# Patient Record
Sex: Male | Born: 2000 | Race: White | Hispanic: No | Marital: Single | Smoking: Current every day smoker
Health system: Southern US, Community
[De-identification: ages and names within clinical notes are randomized; demographics above are authoritative.]

## PROBLEM LIST (undated history)

## (undated) DIAGNOSIS — R569 Unspecified convulsions: Secondary | ICD-10-CM

## (undated) DIAGNOSIS — F329 Major depressive disorder, single episode, unspecified: Secondary | ICD-10-CM

## (undated) DIAGNOSIS — F4010 Social phobia, unspecified: Secondary | ICD-10-CM

## (undated) DIAGNOSIS — J302 Other seasonal allergic rhinitis: Secondary | ICD-10-CM

## (undated) DIAGNOSIS — F4310 Post-traumatic stress disorder, unspecified: Secondary | ICD-10-CM

## (undated) DIAGNOSIS — F840 Autistic disorder: Secondary | ICD-10-CM

## (undated) DIAGNOSIS — F909 Attention-deficit hyperactivity disorder, unspecified type: Secondary | ICD-10-CM

## (undated) HISTORY — PX: CIRCUMCISION: SUR203

## (undated) HISTORY — DX: Unspecified convulsions: R56.9

---
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
## 2020-01-17 ENCOUNTER — Other Ambulatory Visit (INDEPENDENT_AMBULATORY_CARE_PROVIDER_SITE_OTHER): Payer: Self-pay | Admitting: Pediatrics

## 2020-01-17 DIAGNOSIS — G40309 Generalized idiopathic epilepsy and epileptic syndromes, not intractable, without status epilepticus: Secondary | ICD-10-CM

## 2020-02-20 ENCOUNTER — Other Ambulatory Visit (INDEPENDENT_AMBULATORY_CARE_PROVIDER_SITE_OTHER): Payer: Self-pay | Admitting: Pediatrics

## 2020-02-20 DIAGNOSIS — G40309 Generalized idiopathic epilepsy and epileptic syndromes, not intractable, without status epilepticus: Secondary | ICD-10-CM

## 2020-02-20 DIAGNOSIS — G4700 Insomnia, unspecified: Secondary | ICD-10-CM

## 2020-02-21 ENCOUNTER — Telehealth (INDEPENDENT_AMBULATORY_CARE_PROVIDER_SITE_OTHER): Payer: Self-pay | Admitting: Pediatrics

## 2020-02-21 NOTE — Telephone Encounter (Signed)
Electronic refill request has been sent to Dr. Sharene Skeans in response to this message

## 2020-02-21 NOTE — Telephone Encounter (Signed)
Please send to the pharmacy °

## 2020-02-21 NOTE — Telephone Encounter (Signed)
°  Who's calling (name and relationship to patient) : [name] [facility]   Best contact number: [phone]  Provider they see: Dr. Sharene Skeans  Reason for call: [name] called to request a refill for the Rx: clonazepam and lamotrigine  [name] says that Wes only has enough medication to take in the morning then he will be out of these prescriptions.     PRESCRIPTION REFILL ONLY  Name of prescription: Clonazepam and lamotrigine  [facility] [address]

## 2020-03-21 ENCOUNTER — Other Ambulatory Visit (INDEPENDENT_AMBULATORY_CARE_PROVIDER_SITE_OTHER): Payer: Self-pay | Admitting: Pediatrics

## 2020-03-21 DIAGNOSIS — G40309 Generalized idiopathic epilepsy and epileptic syndromes, not intractable, without status epilepticus: Secondary | ICD-10-CM

## 2020-03-21 DIAGNOSIS — F6381 Intermittent explosive disorder: Secondary | ICD-10-CM

## 2020-03-21 DIAGNOSIS — G4700 Insomnia, unspecified: Secondary | ICD-10-CM

## 2020-03-23 ENCOUNTER — Telehealth (INDEPENDENT_AMBULATORY_CARE_PROVIDER_SITE_OTHER): Payer: Self-pay | Admitting: Pediatrics

## 2020-03-23 NOTE — Telephone Encounter (Signed)
I will not be able to see him this week unless there is a cancellation.  After that I am willing to see him at the next opening that we have even if it is a new patient.

## 2020-03-23 NOTE — Telephone Encounter (Signed)
Patient is scheduled for Wednesday at 11:15

## 2020-03-23 NOTE — Telephone Encounter (Signed)
Please send to the pharmacy °

## 2020-03-23 NOTE — Telephone Encounter (Signed)
Spoke with grandmother about her phone message. She states that he has had three really bad seizures in the last four weeks. She states that one seizure made him fall off the covered porch. She states that he was talking and went out. She states that he had another one at his dad's house where he almost hit the floor but dad caught him. She reports that the other seizure happened in the bathroom. She reports that all of them have made him fall. She states that she think he needs to have a MRI scan to figure out what is going on. She states that the seizures lasted no more than three minutes. She states that the patient was taken to the hospital for the first seizure. She also reports that the patient's memory has gotten so bad that he cannot retain any information. Please advise.  Refills have already been sent.

## 2020-03-23 NOTE — Telephone Encounter (Signed)
Who's calling (name and relationship to patient) : [name]   Best contact number: [phone]  Provider they see: Dr. Sharene Skeans  Reason for call: Requesting a refill for amantadine clonazepam lamotrigine   Mom requested a sooner appt. To discuss the three very bad seizures Tareq had in the last four weeks and a possible MRI mom is requesting.   Call ID:      PRESCRIPTION REFILL ONLY  Name of prescription: Amantadine clonazepam lamotrigine  [facility] [address]

## 2020-03-25 ENCOUNTER — Ambulatory Visit (INDEPENDENT_AMBULATORY_CARE_PROVIDER_SITE_OTHER): Payer: Medicaid Other | Admitting: Pediatrics

## 2020-03-25 ENCOUNTER — Encounter (INDEPENDENT_AMBULATORY_CARE_PROVIDER_SITE_OTHER): Payer: Self-pay | Admitting: Pediatrics

## 2020-03-25 ENCOUNTER — Other Ambulatory Visit: Payer: Self-pay

## 2020-03-25 VITALS — BP 100/70 | HR 64 | Ht 68.0 in | Wt 177.2 lb

## 2020-03-25 DIAGNOSIS — G40209 Localization-related (focal) (partial) symptomatic epilepsy and epileptic syndromes with complex partial seizures, not intractable, without status epilepticus: Secondary | ICD-10-CM

## 2020-03-25 DIAGNOSIS — F6381 Intermittent explosive disorder: Secondary | ICD-10-CM

## 2020-03-25 DIAGNOSIS — G40309 Generalized idiopathic epilepsy and epileptic syndromes, not intractable, without status epilepticus: Secondary | ICD-10-CM

## 2020-03-25 DIAGNOSIS — G4700 Insomnia, unspecified: Secondary | ICD-10-CM

## 2020-03-25 DIAGNOSIS — F840 Autistic disorder: Secondary | ICD-10-CM | POA: Diagnosis not present

## 2020-03-25 MED ORDER — LAMOTRIGINE 100 MG PO TABS: ORAL_TABLET | ORAL | 5 refills | Status: DC

## 2020-03-25 MED ORDER — CLONAZEPAM 0.5 MG PO TABS: ORAL_TABLET | ORAL | 5 refills | Status: DC

## 2020-03-25 MED ORDER — LEVETIRACETAM 500 MG PO TABS: ORAL_TABLET | ORAL | 5 refills | Status: DC

## 2020-03-25 MED ORDER — AMANTADINE HCL 100 MG PO CAPS: ORAL_CAPSULE | ORAL | 5 refills | Status: DC

## 2020-03-25 NOTE — Patient Instructions (Signed)
Thank you for coming today.  I am concerned that seizures continue to be such a problem.  We are going to increase the levetiracetam and keep lamotrigine stable I also refill prescriptions for those 2 medications but also for clonazepam and amantadine.  I would like to see him again in 3 months if seizures continue or 6 months if they do not.

## 2020-03-25 NOTE — Progress Notes (Signed)
Patient: Zachary [name] MRN: [id number] Sex: male DOB: 2001/12/01  Provider: Wyline Copas, MD Location of Care: [name]  Note type: Routine return visit  History of Present Illness: Referral Source: Zachary Rickets, PA-C History from: grandmother, patient and [facility] chart Chief Complaint: Seizures  Zachary [name] is a 19 y.o. male who was evaluated March 25, 2020 for the first time since July 03, 2019.  He has photic sensitive generalized tonic-clonic seizures and history of focal epilepsy with prolonged staring spells and post ictal confusion.  He also has problems with mood, depression explosive anger, and aggressive behavior.  1 evaluation suggested that he functions on the autism spectrum which would provide explanation for some of his behaviors.  He returns today because he has had 3 generalized tonic-clonic seizures over the last 2 weeks.  They occurred March 27, April 3, and April 9.  In one of them, he fell quite hard, striking his head and cutting his ear.  Interestingly for all of these, he was not out in the sun and we cannot blame flickering light on the episodes.  Two happened at his grandmother's house and one at his father's house.  There was nothing to precipitate the episodes.  He has been compliant with taking his medication.  He has been on long-term lamotrigine which I have not changed.  Levetiracetam has been introduced and increased but there is much further room to push its dose.  He still experiences headaches a few times a week.  Sometimes they are severe enough to cause him to lay down.  He takes Goody's powder and usually that helps lessen his symptoms.  In general his health is good.  His weight is up 13 pounds with no change in height.  I am concerned about that.  Some of that is growing out in terms of his muscles.  Some of it is not healthy.  Zachary [name].  It is not clear to me how he spends his days but I believe he does  chores around home.  His mood and behavior has been problematic.  Zachary [name] has prescribed medications to address them.  Review of Systems: A complete review of systems was remarkable for patient is here to be seen for seizures. grandmothr states that the patient has had three severe seizures in the past four weeks. She states that the patient was standing on a covered porch, talking and just fell. She staets that the second one was at his dads house and the same thing happend. The third seizure was while he was in the bathroom. She has given Zachary [name] the dates of the seizures. She states that none of the seizures happened due to being in the sun. She has no other concerns at this time., all other systems reviewed and negative.  Past Medical History Diagnosis Date  . ADHD (attention deficit hyperactivity disorder)   . Autism   . MDD (major depressive disorder)   . Post traumatic stress disorder (PTSD)   . Seasonal allergies   . Seizures ([place])   . Social anxiety disorder of childhood    Hospitalizations: No., Head Injury: No., Nervous System Infections: No., Immunizations up to date: Yes.    Copied from prior chart He had a two-minute generalized tonic-clonic seizure on February 19, 2011. This was associated with postictal confusion and drowsiness.   He has an EEG that shows high-amplitude generalized spike, polyspike or slow wave discharges that occurs singly and in one to  two-second clusters at 2-1/2 Hz without clinical accompaniments. This is consistent with juvenile myoclonic epilepsy.([place])  It was my opinion that he had photic induced generalized tonic-clonic seizures, but photic simulation did not cause any changes, however, flickering of the sunlight through the trees induced unresponsive staring followed by generalized tonic-clonic seizure in March 2013. He has a normal MRI of the brain, Apr 14, 2011 [name]). Normal CT scan February 19, 2011 [facility])  He did not  tolerate Depakote because of agitation and was switched to lamotrigine, which he has tolerated, but is taking very low doses. He was seen by the [facility] in [facility] and also at [facility]. He has attention deficit disorder and has been on the combination of Vyvanse and Intuniv during the [name] year.  He had a second seizure on February 13, 2012, while traveling in car with his father, there was a flicker of sunlight through the trees, he began to blink, stare, and then has a generalized tonic-clonic seizure just as he had had one year before. This lasted for one to two minutes.  Hospitalization at [name] January 17-23, 2012 was for suicide risk, depression, dangerous disruptive behavior, anxiety, and parental divorce. He was hospitalized for 6 days. Final diagnosis was depressive disorder, generalized anxiety disorder, attention deficit hyperactivity disorder combined type he was thought to have a learning disorder. There were significant family problems. He no longer exhibited suicidal or homicidal ideation. He was discharged on Vyvanse and Intuniv. He was supposed to see Dr. Lucianne Muss January 17, 2011. That did not take place.  Verbal comprehension index of 65, perceptual reasoning of 60, and full scale IQ of 65.  Psychologic evaluation suggested that he might function on the autism spectrum. If that is the case, it would be a level 1 because of his preserved language and his mild intellectual disability.  Birth History 7 pound infant born at full-term to a 15 year old gravida 2 para 26 male Mother smoked throughout the pregnancy. Mother had preterm labor and was placed on bed rest for 3 days. Labor lasted for 8 hours, was induced Normal spontaneous vaginal delivery Growth and development was recalled as normal.  Behavior History anger, sadness and withdrawn  He was followed by a psychiatrist at [facility] and also has a therapist who  is Peggye Ley. He had been placed on Cymbalta previously and then sertraline. Currently, he takes lamotrigine for seizures, melatonin to help him sleep, and amantadine for his mood. The latter has worked better than all of his other medications. He generally is calm. He has rare episodes of anger. He has not had any side effects from the amantadine.   Surgical History Procedure Laterality Date  . CIRCUMCISION  2002   Family History family history includes Asthma in his sister; Heart attack in his maternal grandfather; Other in his father and mother; Seizures in his maternal grandmother and mother. Family history is negative for migraines, intellectual disabilities, blindness, deafness, birth defects, chromosomal disorder, or autism.  Social History Socioeconomic History  . Marital status: Single  . Years of education:  19  . Highest education level:  Eighth grade  Occupational History  . Not employed  Tobacco Use  . Smoking status: Never Smoker  . Smokeless tobacco: Never Used  Substance and Sexual Activity  . Alcohol use: No  . Drug use: No  . Sexual activity: Never  Social History Narrative    Finley is no longer in [name].  He lives with his grandmother/guardian.    He enjoys working with his hands, mowing, weed eating, drawing, and playing on his phone.   Allergies Allergen Reactions  . Strawberry Extract Rash   Physical Exam BP 100/70   Pulse 64   Ht 5\' 8"  (1.727 m)   Wt 177 lb 3.2 oz (80.4 kg)   BMI 26.94 kg/m   General: alert, well developed, overweight, in no acute distress, red, dyed yellow in a [place] hair, brown eyes, right handed Head: normocephalic, no dysmorphic features Ears, Nose and Throat: Otoscopic: tympanic membranes normal; pharynx: oropharynx is pink without exudates or tonsillar hypertrophy Neck: supple, full range of motion, no cranial or cervical bruits Respiratory: auscultation clear Cardiovascular: no murmurs, pulses are  normal Musculoskeletal: no skeletal deformities or apparent scoliosis Skin: no rashes or neurocutaneous lesions  Neurologic Exam  Mental Status: alert; oriented to person; knowledge is below normal for age; language is adequate for conversation and following commands, but he makes no eye contact and says very little Cranial Nerves: visual fields are full to double simultaneous stimuli; extraocular movements are full and conjugate; pupils are round reactive to light; funduscopic examination shows sharp disc margins with normal vessels; symmetnic facial strength; midline tongue and uvula; air conduction is greater than bone conduction bilaterally Motor: Normal strength, tone and mass; good fine motor movements; no pronator drift Sensory: intact responses to cold, vibration, proprioception and stereognosis Coordination: good finger-to-nose, rapid repetitive alternating movements and finger apposition Gait and Station: normal gait and station: patient is able to walk on heels, toes and tandem without difficulty; balance is adequate; Romberg exam is negative; Gower response is negative Reflexes: symmetric and diminished bilaterally; no clonus; bilateral flexor plantar responses  Assessment 1.  Generalized convulsive epilepsy, G40.309. 2.  Focal epilepsy with impairment of consciousness, not intractable, G40.209. 3.  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 4.  Intermittent explosive disorder, F63.81.  Discussion I am concerned about the recent seizures.  Lamotrigine has failed to bring his seizures under control despite good levels.  We have pushed levetiracetam to a moderate degree but can go further and therefore should try it.  At some point, we may need to repeat his EEG to see if it provides any insights.  Plan Increase levetiracetam to 500 mg tablets 2 twice daily.  Return visit in 3 months' time.  I  will see him sooner based on clinical need.  I asked his grandmother to use MyChart to communicate with me.  I will leave the issues of his behavior to Dr. .   Medication List   Accurate as of March 25, 2020  9:59 PM. If you have any questions, ask your nurse or doctor.    acetaminophen 500 MG tablet Commonly known as: TYLENOL Take 500 mg by mouth every 6 (six) hours as needed for mild pain.   amantadine 100 MG capsule Commonly known as: SYMMETREL TAKE 1 CAPSULE BY MOUTH TWICE A DAY   clonazePAM 0.5 MG tablet Commonly known as: KLONOPIN TAKE 2 AND 1/2 TABLETS BY MOUTH AT BEDTIME   lamoTRIgine 100 MG tablet Commonly known as: LAMICTAL Take 4 tablets in the morning and 5 tablets at nighttime What changed: See the new instructions. Changed by: March 27, 2020, MD   levETIRAcetam 500 MG tablet Commonly known as: KEPPRA Take 2 tablets twice daily What changed: additional instructions Changed by: Ellison Carwin, MD   Melatonin 10 MG Caps Take 1 capsule  by mouth at bedtime.    The medication list was reviewed and reconciled. All changes or newly prescribed medications were explained.  A complete medication list was provided to the patient/caregiver.  Deetta Perla MD

## 2020-04-07 ENCOUNTER — Ambulatory Visit (INDEPENDENT_AMBULATORY_CARE_PROVIDER_SITE_OTHER): Payer: Medicaid Other | Admitting: Pediatrics

## 2020-04-18 IMAGING — DX DG FOOT COMPLETE 3+V*L*
3 series · 3 of 3 positions shown · non-contrast
Comparison: None.

CLINICAL DATA: Fall from tree

EXAM:
LEFT FOOT - COMPLETE 3+ VIEW

[foot ap]
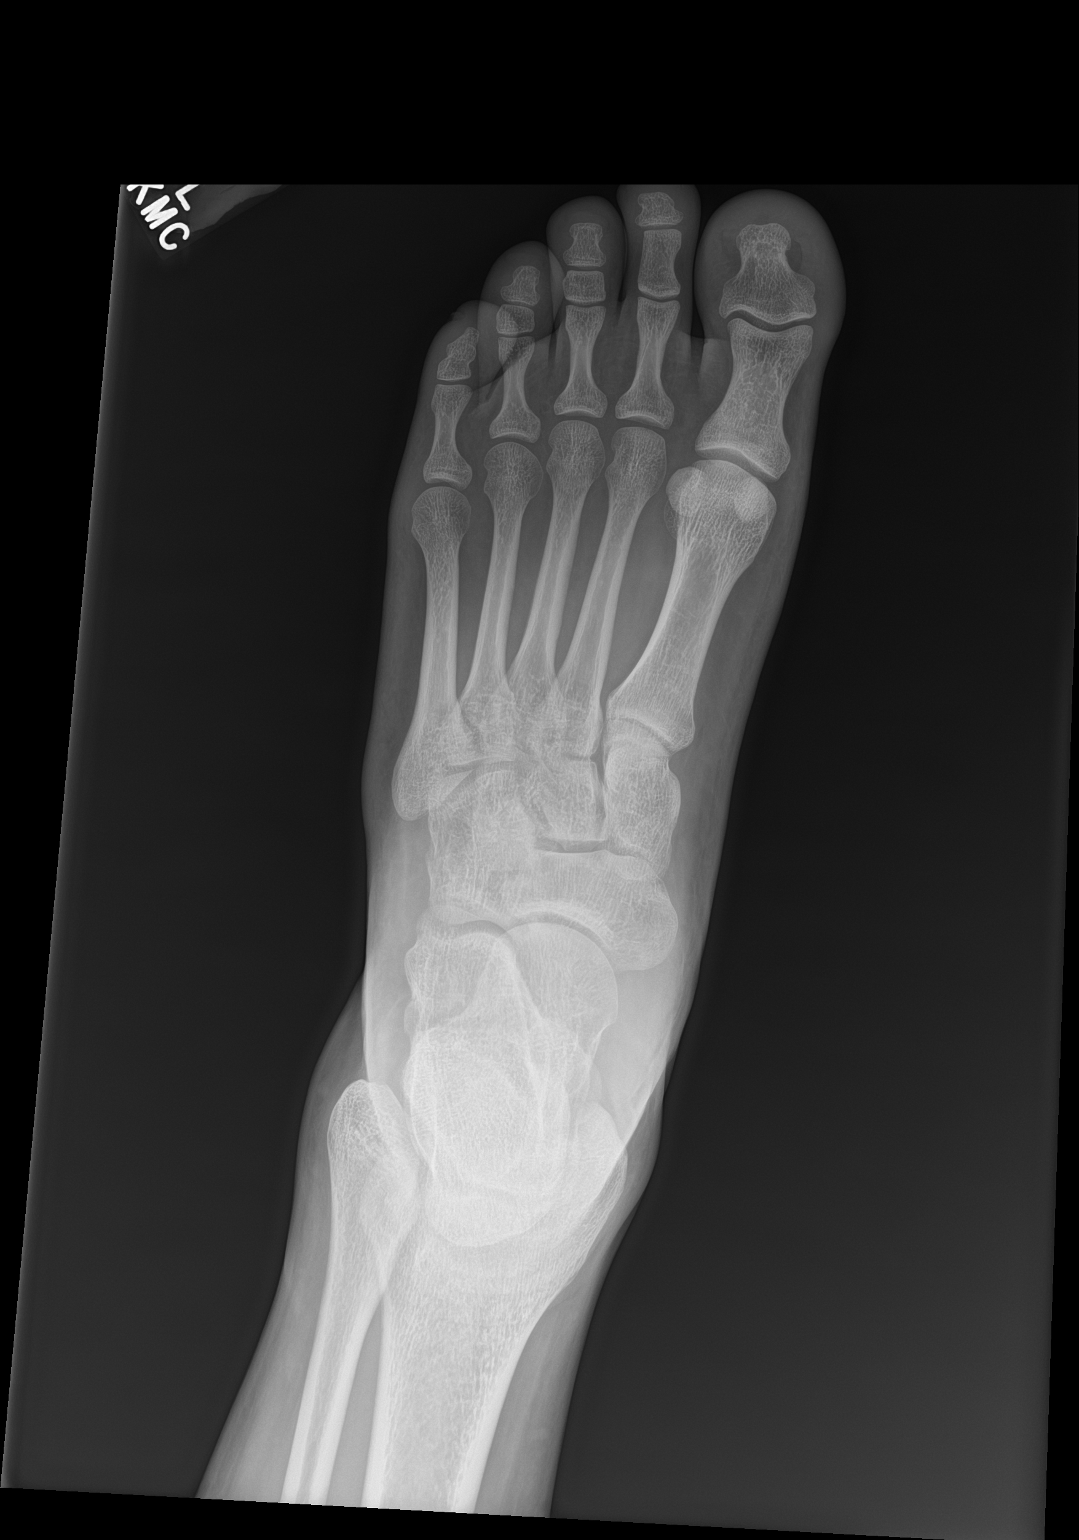

[foot obl]
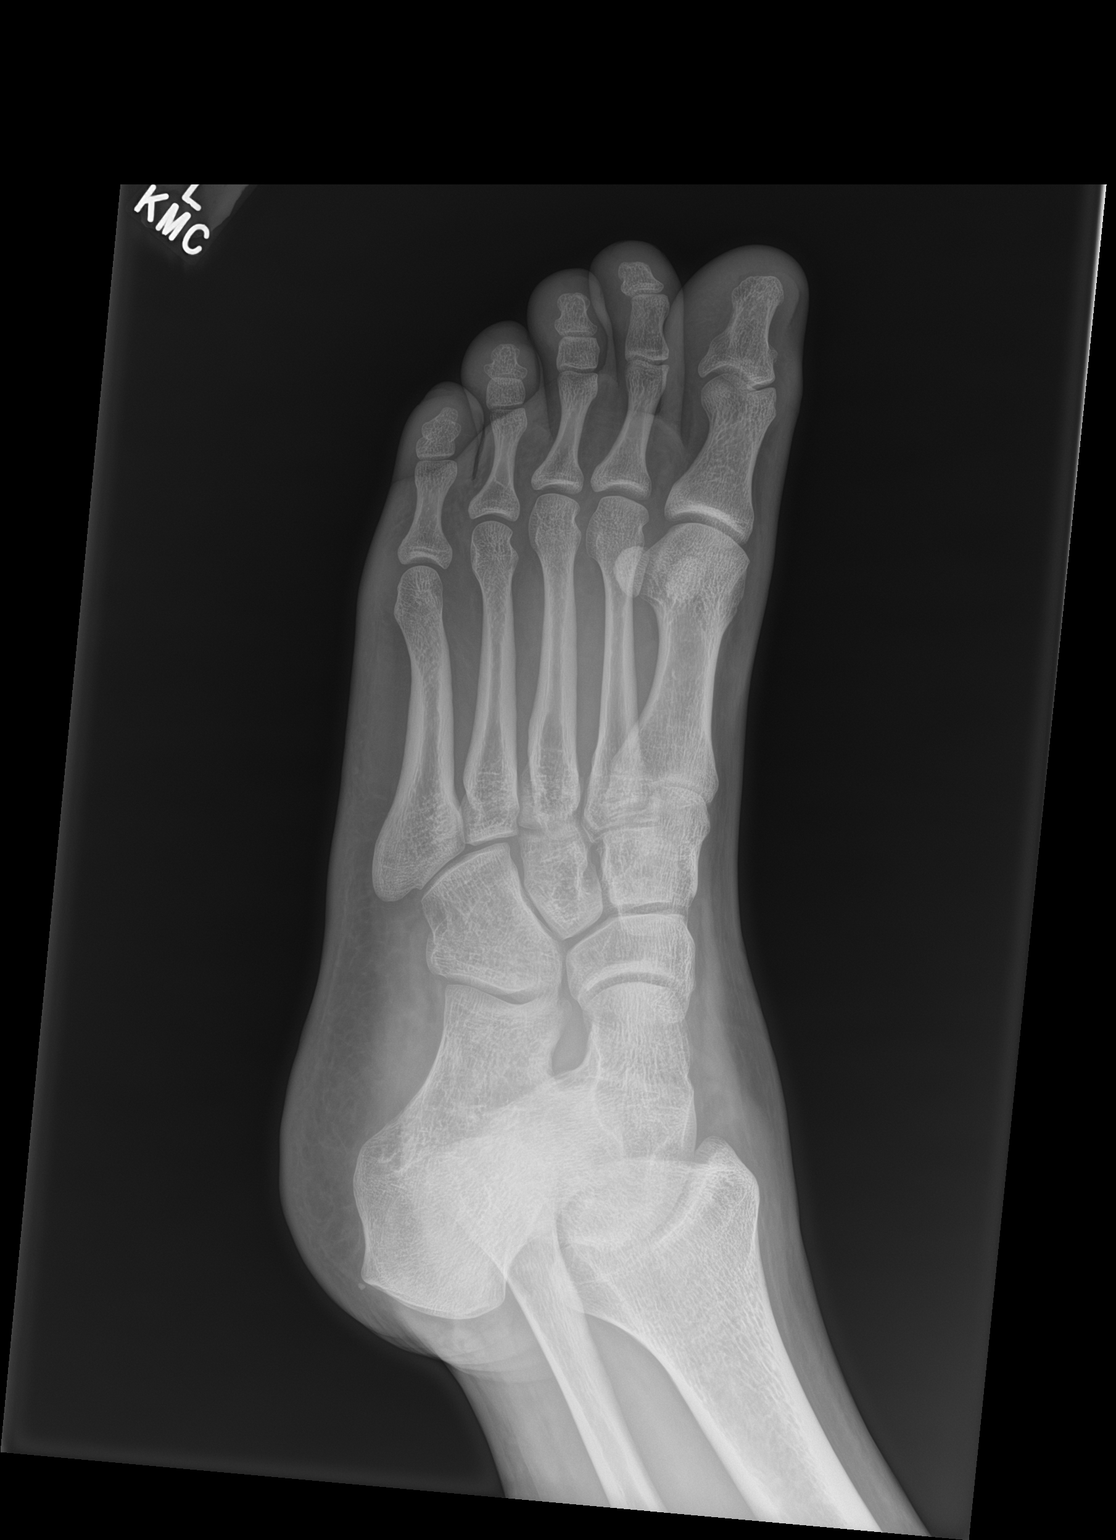

[foot lat]
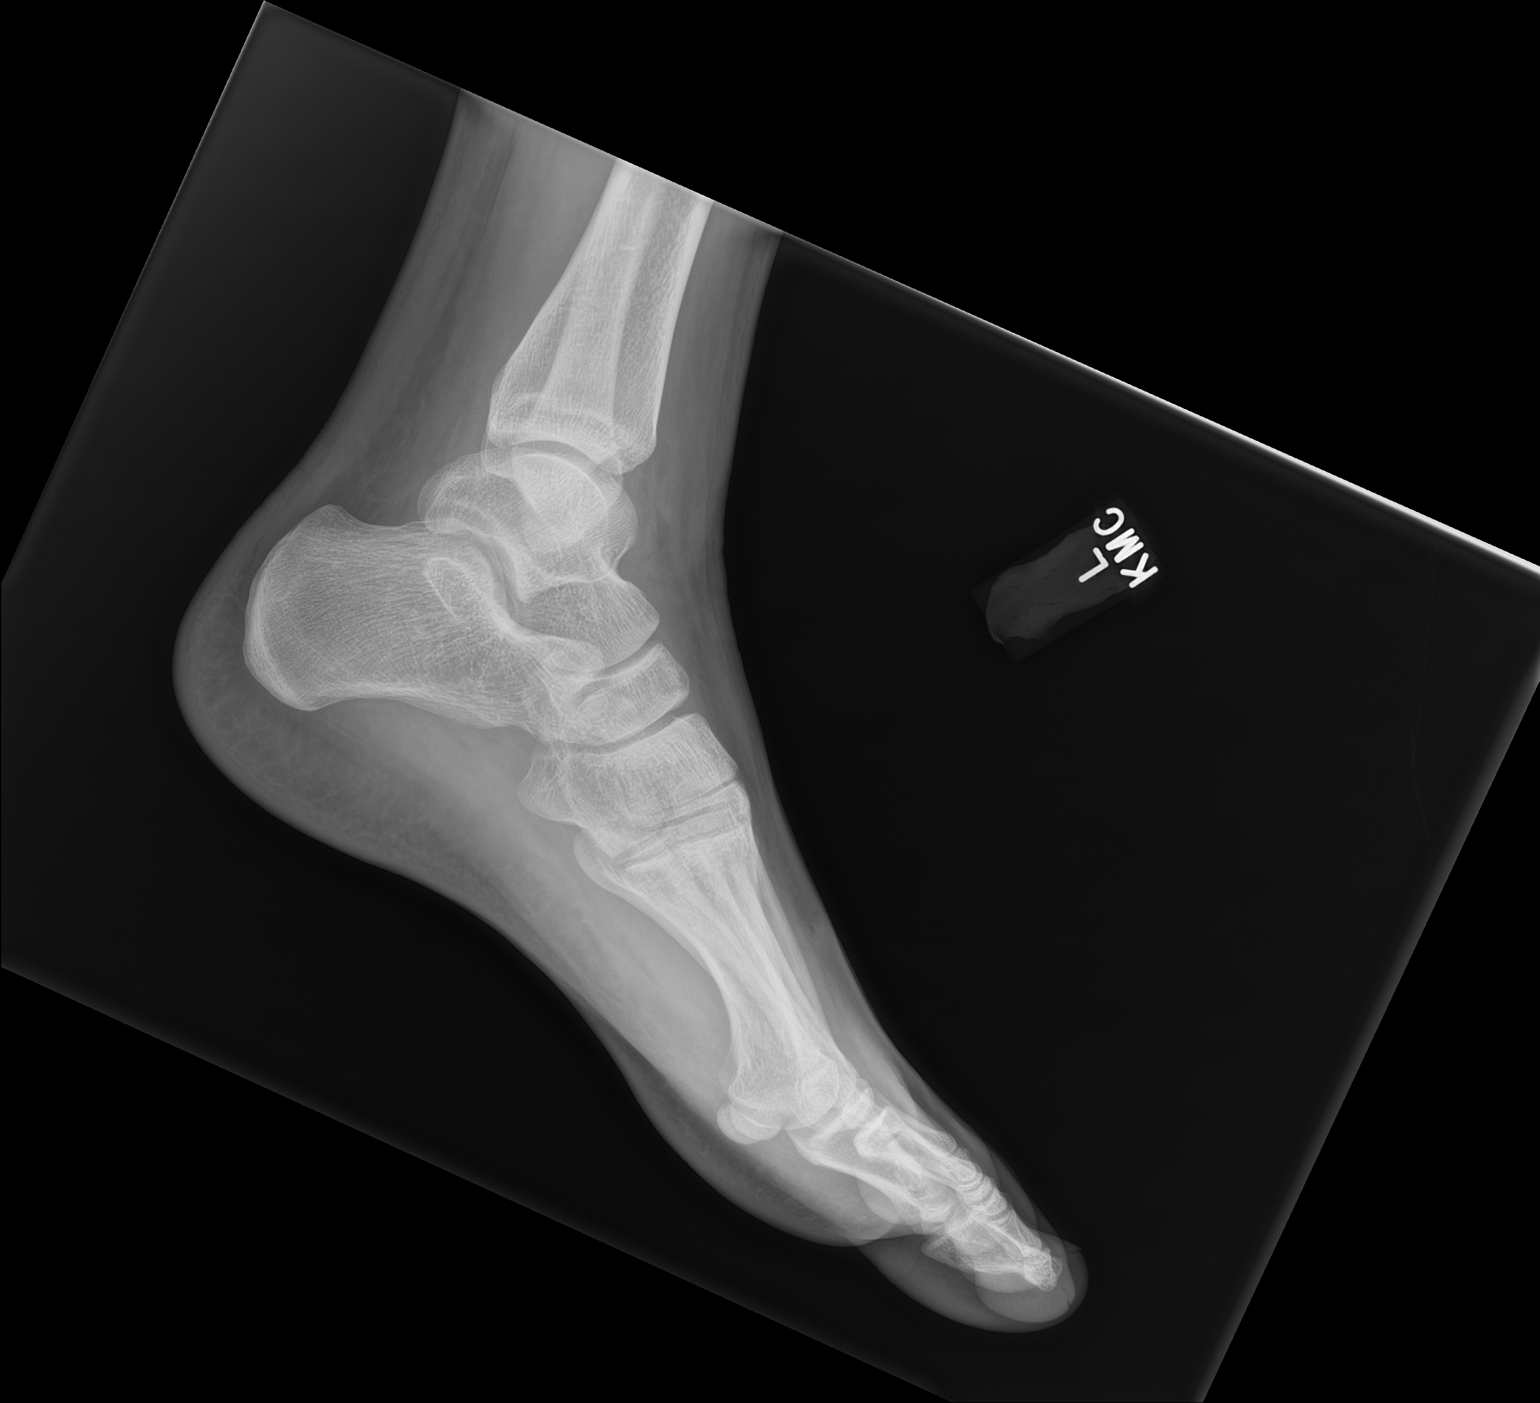

[3 of 3 positions shown; findings below may reference images not displayed]

FINDINGS: There is no evidence of fracture or dislocation. There is no
evidence of arthropathy or other focal bone abnormality. Mild dorsal
soft tissue swelling.
IMPRESSION: No acute osseous abnormality.

## 2020-05-13 ENCOUNTER — Ambulatory Visit (INDEPENDENT_AMBULATORY_CARE_PROVIDER_SITE_OTHER): Payer: Medicaid Other | Admitting: Pediatrics

## 2020-06-28 ENCOUNTER — Other Ambulatory Visit (INDEPENDENT_AMBULATORY_CARE_PROVIDER_SITE_OTHER): Payer: Self-pay | Admitting: Pediatrics

## 2020-06-28 DIAGNOSIS — G4700 Insomnia, unspecified: Secondary | ICD-10-CM

## 2020-06-29 NOTE — Telephone Encounter (Signed)
Please send to the pharmacy °

## 2020-07-07 ENCOUNTER — Ambulatory Visit (INDEPENDENT_AMBULATORY_CARE_PROVIDER_SITE_OTHER): Payer: Medicaid Other | Admitting: Pediatrics

## 2020-09-05 ENCOUNTER — Telehealth (INDEPENDENT_AMBULATORY_CARE_PROVIDER_SITE_OTHER): Payer: Self-pay | Admitting: Family

## 2020-09-05 DIAGNOSIS — G4700 Insomnia, unspecified: Secondary | ICD-10-CM

## 2020-09-17 NOTE — Telephone Encounter (Signed)
Called 2x to schedule no VM set up

## 2020-09-24 ENCOUNTER — Other Ambulatory Visit (INDEPENDENT_AMBULATORY_CARE_PROVIDER_SITE_OTHER): Payer: Self-pay | Admitting: Pediatrics

## 2020-09-24 DIAGNOSIS — G40209 Localization-related (focal) (partial) symptomatic epilepsy and epileptic syndromes with complex partial seizures, not intractable, without status epilepticus: Secondary | ICD-10-CM

## 2020-09-24 DIAGNOSIS — G40309 Generalized idiopathic epilepsy and epileptic syndromes, not intractable, without status epilepticus: Secondary | ICD-10-CM

## 2020-10-07 ENCOUNTER — Encounter (INDEPENDENT_AMBULATORY_CARE_PROVIDER_SITE_OTHER): Payer: Self-pay | Admitting: Pediatrics

## 2020-10-07 ENCOUNTER — Telehealth (INDEPENDENT_AMBULATORY_CARE_PROVIDER_SITE_OTHER): Payer: Medicaid Other | Admitting: Pediatrics

## 2020-10-07 VITALS — Wt 175.0 lb

## 2020-10-07 DIAGNOSIS — G4700 Insomnia, unspecified: Secondary | ICD-10-CM | POA: Diagnosis not present

## 2020-10-07 DIAGNOSIS — G40209 Localization-related (focal) (partial) symptomatic epilepsy and epileptic syndromes with complex partial seizures, not intractable, without status epilepticus: Secondary | ICD-10-CM

## 2020-10-07 DIAGNOSIS — G40309 Generalized idiopathic epilepsy and epileptic syndromes, not intractable, without status epilepticus: Secondary | ICD-10-CM

## 2020-10-07 DIAGNOSIS — F6381 Intermittent explosive disorder: Secondary | ICD-10-CM

## 2020-10-07 DIAGNOSIS — F70 Mild intellectual disabilities: Secondary | ICD-10-CM | POA: Diagnosis not present

## 2020-10-07 MED ORDER — AMANTADINE HCL 100 MG PO CAPS: ORAL_CAPSULE | ORAL | 5 refills | Status: DC

## 2020-10-07 MED ORDER — LAMOTRIGINE 100 MG PO TABS: ORAL_TABLET | ORAL | 5 refills | Status: DC

## 2020-10-07 MED ORDER — CLONAZEPAM 0.5 MG PO TABS: ORAL_TABLET | ORAL | 5 refills | Status: DC

## 2020-10-07 MED ORDER — LEVETIRACETAM 500 MG PO TABS: ORAL_TABLET | ORAL | 5 refills | Status: DC

## 2020-10-07 NOTE — Patient Instructions (Signed)
It was a pleasure to see you today.  I am glad that there have been no seizures since that there are no significant problems with mood and behavior at this time.  I refilled your prescriptions for 6 months.  I would like to see you in return in 6 months.  I told you that I will need to look for an adult neurologist.  We might consider the neurologist in [facility] or at [facility] given where you live.

## 2020-10-07 NOTE — Progress Notes (Signed)
This is a Pediatric Specialist E-Visit follow up consult provided via MyChart Zachary [name] and their parent/guardian Zachary [name] (name of consenting adult) consented to an E-Visit consult today.  Location of patient: Zachary [name] is at home Location of provider: Jack [name] is at office of [facility] on [facility] Patient was referred by Zachary [name]   The following participants were involved in this E-Visit: Patient, grandmother, Dr. Sharene Skeans, and CMA (list of participants and their roles)  Chief Complaint/ Reason for E-Visit today: Seizures Total time on call: 15 minutes Follow up: 6 months   Patient: Zachary [name] MRN: [id number] Sex: male DOB: November 16, 2001  Provider: Ellison Carwin, MD Location of Care: [facility]  Note type: Routine return visit  History of Present Illness: Referral Source: Zachary Rickets, [name] History from: grandmother, patient and [facility] chart Chief Complaint: Seizures  Zachary [name] is a 19 y.o. male who was evaluated virtually October 07, 2020 for the first time since March 25, 2020.  He has photosensitive generalized tonic-clonic seizures and history of focal epilepsy with prolonged staring spells with postictal confusion.  In the past he has had problems with mood, depressive, explosive anger, and aggressive behavior.  He had one evaluation that suggest that he functions on the autism spectrum, but that has not been confirmed.  Fortunately there have been no seizures since his last visit.  He takes and tolerates his medication without side effects.  He lost 2 pounds since his last visit.  He falls asleep easily and sleeps soundly.  He is not working outside the home.  At times he spends weekends with his father working in the shop.  His general health is good.  No one in the family has contracted Covid.  They basically keep to themselves including not going to church and having groceries delivered.  No one has been  vaccinated.  The been no significant problems in his mood and behavior.  He is a big help to his grandmother around the home.  No other concerns were raised today.  Review of Systems: A complete review of systems was remarkable for patient is here to be seen for seizures. Grandmother reports that the patient has been doing very well. She states that he has not had any seizures since his last visit. She reports no concerns at this time., all other systems reviewed and negative.  Past Medical History Diagnosis Date  . ADHD (attention deficit hyperactivity disorder)   . Autism   . MDD (major depressive disorder)   . Post traumatic stress disorder (PTSD)   . Seasonal allergies   . Seizures ([facility])   . Social anxiety disorder of childhood    Hospitalizations: No., Head Injury: No., Nervous System Infections: No., Immunizations up to date: Yes.    Copied from prior chart notes He had a two-minute generalized tonic-clonic seizure on February 19, 2011. This was associated with postictal confusion and drowsiness.   He has an EEG that shows high-amplitude generalized spike, polyspike or slow wave discharges that occurs singly and in one to two-second clusters at 2-1/2 Hz without clinical accompaniments. This is consistent with juvenile myoclonic epilepsy.([place])  It was my opinion that he had photic induced generalized tonic-clonic seizures, but photic simulation did not cause any changes, however, flickering of the sunlight through the trees induced unresponsive staring followed by generalized tonic-clonic seizure in March 2013. He has a normal MRI of the brain, Apr 14, 2011 [facility]). Normal CT scan February 19, 2011 [facility])  He did not tolerate Depakote because of agitation and was switched to lamotrigine, which he has tolerated, but is taking very low doses. He was seen by the [facility] in [facility] and also at [facility]. He has attention deficit disorder and  has been on the combination of Vyvanse and Intuniv during the school year.  He had a second seizure on February 13, 2012, while traveling in car with his father, there was a flicker of sunlight through the trees, he began to blink, stare, and then has a generalized tonic-clonic seizure just as he had had one year before. This lasted for one to two minutes.  Hospitalization at [facility] January 17-23, 2012 was for suicide risk, depression, dangerous disruptive behavior, anxiety, and parental divorce. He was hospitalized for 6 days. Final diagnosis was depressive disorder, generalized anxiety disorder, attention deficit hyperactivity disorder combined type he was thought to have a learning disorder. There were significant family problems. He no longer exhibited suicidal or homicidal ideation. He was discharged on Vyvanse and Intuniv. He was supposed to see Dr. Lucianne Muss January 17, 2011. That did not take place.  Verbal comprehension index of 65, perceptual reasoning of 60, and full scale IQ of 65.  Psychologic evaluation suggested that he might function on the autism spectrum. If that is the case, it would be a level 1 because of his preserved language and his mild intellectual disability.  Birth History 7 pound infant born at full-term to a 21 year old gravida 2 para 3 male Mother smoked throughout the pregnancy. Mother had preterm labor and was placed on bed rest for 3 days. Labor lasted for 8 hours, was induced Normal spontaneous vaginal delivery Growth and development was recalled as normal.  Behavior History anger, sadness and withdrawn  He was followed by a psychiatrist at [facility] and also has a therapist who is Peggye Ley. He had been placed on Cymbalta previously and then sertraline. Currently, he takes lamotrigine for seizures, melatonin to help him sleep, and amantadine for his mood. The latter has worked better than all of his other medications. He  generally is calm. He has rare episodes of anger. He has not had any side effects from the amantadine.   Surgical History Procedure Laterality Date  . CIRCUMCISION  2002   Family History family history includes Asthma in his sister; Heart attack in his maternal grandfather; Other in his father and mother; Seizures in his maternal grandmother and mother. Family history is negative for migraines, intellectual disabilities, blindness, deafness, birth defects, chromosomal disorder, or autism.  Social History Socioeconomic History  . Marital status: Single  . Years of education:  75  . Highest education level:  Eighth grade  Occupational History  . Not employed  Tobacco Use  . Smoking status: Never Smoker  . Smokeless tobacco: Never Used  Substance and Sexual Activity  . Alcohol use: No  . Drug use: No  . Sexual activity: Never  Social History Narrative    Reino is no longer in school.    He lives with his grandmother/guardian.    He enjoys working with his hands, mowing, weed eating, drawing, and playing on his phone.   Allergies Allergen Reactions  . Strawberry Extract Rash   Physical Exam Wt 175 lb (79.4 kg)   BMI 26.61 kg/m   General: alert, well developed, well nourished, in no acute distress, red hair, brown eyes, right handed Head: normocephalic, no dysmorphic features Neck: supple,  full range of motion Musculoskeletal: no skeletal deformities or apparent scoliosis Skin: no rashes or neurocutaneous lesions  Neurologic Exam  Mental Status: alert; oriented to person; knowledge is below normal for age; language is adequate for receptive and expressive language Cranial Nerves: visual fields are full to double simultaneous stimuli; extraocular movements are full and conjugate;  symmetric facial strength; midline tongue; hearing is normal bilaterally Motor: normal functional strength, tone and mass; good fine motor movements; no pronator drift Coordination: good  finger-to-nose, rapid repetitive alternating movements and finger apposition Gait and Station: normal gait and station; balance is adequate; Romberg exam is negative  Assessment 1.  Focal epilepsy with impairment of consciousness, G40.209. 2.  Generalized convulsive epilepsy, G40.309. 3.  Intermittent explosive disorder, F63.81. 4.  Mild intellectual disability, F70.  Discussion I am pleased that Kaegan is seizure-free.  I am not certain why he is doing so well but it certainly possible that there have been some changes in his brain chemistry that are inhibiting seizures.  It is also possible he is more compliant with his medical regimen.  Plan He will return to see me in 6 months' time.  Prescriptions were refilled for amantadine, clonazepam, levetiracetam, and lamotrigine.  Greater than 50% of a 15-minute visit was spent in counseling and coordination of care concerning his seizures and his behavior.  I am pleased that his weight is stable.  I discussed the need for transition to another adult neurologist that will take place after I see him in 6 months.  I will retire in September 2022.   Medication List   Accurate as of October 07, 2020 10:03 AM. If you have any questions, ask your nurse or doctor.    acetaminophen 500 MG tablet Commonly known as: TYLENOL Take 500 mg by mouth every 6 (six) hours as needed for mild pain.   amantadine 100 MG capsule Commonly known as: SYMMETREL TAKE 1 CAPSULE BY MOUTH TWICE A DAY   clonazePAM 0.5 MG tablet Commonly known as: KLONOPIN TAKE 2 AND 1/2 TABLETS BY MOUTH AT BEDTIME   lamoTRIgine 100 MG tablet Commonly known as: LAMICTAL Take 4 tablets in the morning and 5 tablets at nighttime   levETIRAcetam 500 MG tablet Commonly known as: KEPPRA TAKE 2 TABLETS BY MOUTH TWICE A DAY   Melatonin 10 MG Caps Take 1 capsule by mouth at bedtime.    The medication list was reviewed and reconciled. All changes or newly prescribed medications were  explained.  A complete medication list was provided to the patient/caregiver.  Deetta Perla MD

## 2020-11-14 ENCOUNTER — Other Ambulatory Visit (INDEPENDENT_AMBULATORY_CARE_PROVIDER_SITE_OTHER): Payer: Self-pay | Admitting: Pediatrics

## 2020-11-14 DIAGNOSIS — G4700 Insomnia, unspecified: Secondary | ICD-10-CM

## 2020-11-16 NOTE — Telephone Encounter (Signed)
Please send to the pharmacy °

## 2020-11-16 NOTE — Telephone Encounter (Signed)
Prescription was electronically refilled for 4 months.

## 2020-12-02 ENCOUNTER — Telehealth (INDEPENDENT_AMBULATORY_CARE_PROVIDER_SITE_OTHER): Payer: Self-pay | Admitting: Family

## 2020-12-02 DIAGNOSIS — F6381 Intermittent explosive disorder: Secondary | ICD-10-CM

## 2020-12-02 MED ORDER — AMANTADINE HCL 100 MG PO CAPS: ORAL_CAPSULE | ORAL | 5 refills | Status: DC

## 2020-12-02 NOTE — Telephone Encounter (Signed)
I called Ms Zachary [name]. She wanted to be sure that the Rx was sent to the pharmacy. TG

## 2020-12-02 NOTE — Telephone Encounter (Signed)
Who's calling (name and relationship to patient) :[name]   Best contact [phone]  Provider they see:Dr.Tina Goodpasture   Reason for call:Medication Refill/ Out of medicine. Requested a call back from Zachary [name]   Call ID:      PRESCRIPTION REFILL ONLY  Name of prescription:Amantadine   Pharmacy:[facility] [address], [place] [address]

## 2020-12-02 NOTE — Telephone Encounter (Signed)
Rx has been sent to the pharmacy

## 2021-03-18 ENCOUNTER — Ambulatory Visit (INDEPENDENT_AMBULATORY_CARE_PROVIDER_SITE_OTHER): Payer: Medicaid Other | Admitting: Pediatrics

## 2021-03-18 ENCOUNTER — Other Ambulatory Visit: Payer: Self-pay

## 2021-03-18 ENCOUNTER — Encounter (INDEPENDENT_AMBULATORY_CARE_PROVIDER_SITE_OTHER): Payer: Self-pay | Admitting: Pediatrics

## 2021-03-18 VITALS — BP 130/90 | HR 72 | Ht 69.5 in | Wt 184.4 lb

## 2021-03-18 DIAGNOSIS — Z72821 Inadequate sleep hygiene: Secondary | ICD-10-CM

## 2021-03-18 DIAGNOSIS — F70 Mild intellectual disabilities: Secondary | ICD-10-CM

## 2021-03-18 DIAGNOSIS — G4700 Insomnia, unspecified: Secondary | ICD-10-CM

## 2021-03-18 DIAGNOSIS — G40209 Localization-related (focal) (partial) symptomatic epilepsy and epileptic syndromes with complex partial seizures, not intractable, without status epilepticus: Secondary | ICD-10-CM | POA: Diagnosis not present

## 2021-03-18 DIAGNOSIS — F6381 Intermittent explosive disorder: Secondary | ICD-10-CM

## 2021-03-18 DIAGNOSIS — G40309 Generalized idiopathic epilepsy and epileptic syndromes, not intractable, without status epilepticus: Secondary | ICD-10-CM

## 2021-03-18 MED ORDER — AMANTADINE HCL 100 MG PO CAPS: ORAL_CAPSULE | ORAL | 5 refills | Status: DC

## 2021-03-18 MED ORDER — LAMOTRIGINE 100 MG PO TABS: ORAL_TABLET | ORAL | 5 refills | Status: DC

## 2021-03-18 MED ORDER — CLONAZEPAM 0.5 MG PO TABS: ORAL_TABLET | ORAL | 4 refills | Status: DC

## 2021-03-18 MED ORDER — LEVETIRACETAM 500 MG PO TABS: ORAL_TABLET | ORAL | 5 refills | Status: DC

## 2021-03-18 NOTE — Progress Notes (Signed)
Patient: Zachary [name] MRN: [id number] Sex: male DOB: Oct 30, 2001  Provider: Ellison Carwin, MD Location of Care: [facility]  Note type: Routine return visit  History of Present Illness: Referral Source: Bill Rickets, PA-C History from: mother, patient and [facility] chart Chief Complaint: Seizures  Zachary [name] is a 20 y.o. male who was evaluated March 18, 2021 for the first time since October 07, 2020.  He has photosensitive generalized tonic-clonic seizures, history of focal epilepsy with prolonged staring spells and postictal confusion.  When he was younger he had significant problems with mood and depression explosive anger and aggressive behavior has not been prominent recently.  Questions were raised about his function on the autism spectrum but has not been proven.  He remains seizure-free.  He takes and tolerates his antiepileptic medication.  His weight has increased by about 9 pounds since October.  He had been maintaining his weight on his previous examination.  His general health is good except for his obesity.  He also has very poor sleep hygiene.  He went to bed around 5:00 this morning and was gotten up around 930.  Consequently he is very tired.  In part he takes long afternoon naps which in all likelihood set him up for nights when he does not go to bed.  He is not working outside the home.  He does not drive.  Review of Systems: A complete review of systems was remarkable for patient is here to be seen for seizures. Mom reports that the patient has been doing well. She states that he has not had any seizures since his last visit. She has no concerns at this time., all other systems reviewed and negative.  Past Medical History Diagnosis Date  . ADHD (attention deficit hyperactivity disorder)   . Autism   . MDD (major depressive disorder)   . Post traumatic stress disorder (PTSD)   . Seasonal allergies   . Seizures (HCC)   . Social anxiety disorder of  childhood    Hospitalizations: No., Head Injury: No., Nervous System Infections: No., Immunizations up to date: Yes.    Copied from prior chart notes He had a two-minute generalized tonic-clonic seizure on February 19, 2011. This was associated with postictal confusion and drowsiness.   He has an EEG that shows high-amplitude generalized spike, polyspike or slow wave discharges that occurs singly and in one to two-second clusters at 2-1/2 Hz without clinical accompaniments. This is consistent with juvenile myoclonic epilepsy.([place])  It was my opinion that he had photic induced generalized tonic-clonic seizures, but photic simulation did not cause any changes, however, flickering of the sunlight through the trees induced unresponsive staring followed by generalized tonic-clonic seizure in March 2013. He has a normal MRI of the brain, Apr 14, 2011 [facility]). Normal CT scan February 19, 2011 [facility])  He did not tolerate Depakote because of agitation and was switched to lamotrigine, which he has tolerated, but is taking very low doses. He was seen by the [facility] in [facility] and also at [facility]. He has attention deficit disorder and has been on the combination of Vyvanse and Intuniv during the school year.  He had a second seizure on February 13, 2012, while traveling in car with his father, there was a flicker of sunlight through the trees, he began to blink, stare, and then has a generalized tonic-clonic seizure just as he had had one year before. This lasted for one to two minutes.  Hospitalization  at [facility] January 17-23, 2012 was for suicide risk, depression, dangerous disruptive behavior, anxiety, and parental divorce. He was hospitalized for 6 days. Final diagnosis was depressive disorder, generalized anxiety disorder, attention deficit hyperactivity disorder combined type he was thought to have a learning disorder. There were  significant family problems. He no longer exhibited suicidal or homicidal ideation. He was discharged on Vyvanse and Intuniv. He was supposed to see Dr. Lucianne Muss January 17, 2011. That did not take place.  Verbal comprehension index of 65, perceptual reasoning of 60, and full scale IQ of 65.  Psychologic evaluation suggested that he might function on the autism spectrum. If that is the case, it would be a level 1 because of his preserved language and his mild intellectual disability.  Birth History 7 pound infant born at full-term to a 74 year old gravida 2 para 55 male Mother smoked throughout the pregnancy. Mother had preterm labor and was placed on bed rest for 3 days. Labor lasted for 8 hours, was induced Normal spontaneous vaginal delivery Growth and development was recalled as normal.  Behavior History He had been followed by a psychiatrist but has not required therapy in some time.  Surgical History Procedure Laterality Date  . CIRCUMCISION  2002   Family History family history includes Asthma in his sister; Heart attack in his maternal grandfather; Other in his father and mother; Seizures in his maternal grandmother and mother. Family history is negative for migraines, intellectual disabilities, blindness, deafness, birth defects, chromosomal disorder, or autism.  Social History Socioeconomic History  . Marital status: Single  . Years of education:  39  . Highest education level:  Eighth grade  Occupational History  . Not employed  Tobacco Use  . Smoking status: Never Smoker  . Smokeless tobacco: Never Used  Substance and Sexual Activity  . Alcohol use: No  . Drug use: No  . Sexual activity: Never  Social History Narrative   Zachary [name] is no longer in school.   He lives with his mother.   He enjoys working with his hands, mowing, weed eating, drawing, and playing on his phone.   Allergies Allergen Reactions  . Strawberry Extract Rash   Physical Exam BP 130/90    Pulse 72   Ht 5' 9.5" (1.765 m)   Wt 184 lb 6.4 oz (83.6 kg)   BMI 26.84 kg/m   General: alert, well developed, well nourished, in no acute distress, red hair, blue eyes, right handed Head: normocephalic, no dysmorphic features Ears, Nose and Throat: Otoscopic: tympanic membranes normal; pharynx: oropharynx is pink without exudates or tonsillar hypertrophy Neck: supple, full range of motion, no cranial or cervical bruits Respiratory: auscultation clear Cardiovascular: no murmurs, pulses are normal Musculoskeletal: no skeletal deformities or apparent scoliosis Skin: no rashes or neurocutaneous lesions  Neurologic Exam  Mental Status: alert; oriented to person, place and year; knowledge is below normal for age; language is normal Cranial Nerves: visual fields are full to double simultaneous stimuli; extraocular movements are full and conjugate; pupils are round reactive to light; funduscopic examination shows sharp disc margins with normal vessels; symmetric facial strength; midline tongue and uvula; air conduction is greater than bone conduction bilaterally Motor: Normal strength, tone and mass; good fine motor movements; no pronator drift Sensory: intact responses to cold, vibration, proprioception and stereognosis Coordination: good finger-to-nose, rapid repetitive alternating movements and finger apposition Gait and Station: normal gait and station: patient is able to walk on heels, toes and tandem without difficulty; balance is  adequate; Romberg exam is negative; Gower response is negative Reflexes: symmetric and diminished bilaterally; no clonus; bilateral flexor plantar responses  Assessment 1.  Focal epilepsy with impairment of consciousness, G40.209. 2.  Generalized convulsive epilepsy, G40.309. 3.  Mild intellectual disability, F70. 4.  Poor sleep hygiene, Z72.821.  Discussion Kaan is medically and neurologically stable.  There is no reason make any changes in his  medications.  Plan I refilled prescriptions for levetiracetam lamotrigine which were used to control his seizures, clonazepam for sleep and amantadine for his behavior.  I am going to seek an adult neurologist to provide long-term care for him.  If that is not possible, he may remain in our practice.  Greater than 50% of a 30-minute visit was spent in counseling and coordination of care concerning his seizures, his sleep disorder, and discussing his lack of daily activities.  I also talked about long-term transitional care.   Medication List   Accurate as of March 18, 2021 11:02 AM. If you have any questions, ask your nurse or doctor.    acetaminophen 500 MG tablet Commonly known as: TYLENOL Take 500 mg by mouth every 6 (six) hours as needed for mild pain.   amantadine 100 MG capsule Commonly known as: SYMMETREL TAKE 1 CAPSULE BY MOUTH TWICE A DAY   clonazePAM 0.5 MG tablet Commonly known as: KLONOPIN TAKE 2 AND 1/2 TABLETS BY MOUTH AT BEDTIME   lamoTRIgine 100 MG tablet Commonly known as: LAMICTAL Take 4 tablets in the morning and 5 tablets at nighttime   levETIRAcetam 500 MG tablet Commonly known as: KEPPRA TAKE 2 TABLETS BY MOUTH TWICE A DAY   Melatonin 10 MG Caps Take 1 capsule by mouth at bedtime.    The medication list was reviewed and reconciled. All changes or newly prescribed medications were explained.  A complete medication list was provided to the patient/caregiver.  Deetta Perla MD

## 2021-03-18 NOTE — Patient Instructions (Signed)
It was a pleasure to see you.  I am glad that you are not having problems of seizures.  I am concerned that your sleep is not consistent and that can lead to inconsistency in taking her medicine and then your seizures may start up again.  We will try to get Dr. Gerilyn Pilgrim to take Ger on is a patient.  I will speak to him personally.  Since he is already taking care of 2 other members of the family I do not think it will be a problem.

## 2021-03-22 ENCOUNTER — Ambulatory Visit (INDEPENDENT_AMBULATORY_CARE_PROVIDER_SITE_OTHER): Payer: Medicaid Other | Admitting: Pediatrics

## 2021-04-15 ENCOUNTER — Encounter (INDEPENDENT_AMBULATORY_CARE_PROVIDER_SITE_OTHER): Payer: Self-pay

## 2021-05-28 ENCOUNTER — Telehealth (INDEPENDENT_AMBULATORY_CARE_PROVIDER_SITE_OTHER): Payer: Self-pay | Admitting: Pediatrics

## 2021-05-28 DIAGNOSIS — G40309 Generalized idiopathic epilepsy and epileptic syndromes, not intractable, without status epilepticus: Secondary | ICD-10-CM

## 2021-05-28 DIAGNOSIS — F70 Mild intellectual disabilities: Secondary | ICD-10-CM

## 2021-05-28 DIAGNOSIS — G40209 Localization-related (focal) (partial) symptomatic epilepsy and epileptic syndromes with complex partial seizures, not intractable, without status epilepticus: Secondary | ICD-10-CM

## 2021-05-28 NOTE — Telephone Encounter (Signed)
Request transfer of care to [facility]

## 2021-08-09 ENCOUNTER — Emergency Department (HOSPITAL_COMMUNITY)
Admission: EM | Admit: 2021-08-09 | Discharge: 2021-08-09 | Disposition: A | Discharge status: Home / Self Care | Payer: Medicaid Other | Attending: Emergency Medicine | Admitting: Emergency Medicine

## 2021-08-09 ENCOUNTER — Other Ambulatory Visit: Payer: Self-pay

## 2021-08-10 ENCOUNTER — Telehealth (INDEPENDENT_AMBULATORY_CARE_PROVIDER_SITE_OTHER): Payer: Self-pay | Admitting: Family

## 2021-08-10 ENCOUNTER — Emergency Department (HOSPITAL_COMMUNITY)
Admission: EM | Admit: 2021-08-10 | Discharge: 2021-08-13 | Disposition: A | Discharge status: Home / Self Care | Payer: Medicaid Other | Attending: Emergency Medicine | Admitting: Emergency Medicine

## 2021-08-10 ENCOUNTER — Other Ambulatory Visit: Payer: Self-pay

## 2021-08-10 ENCOUNTER — Encounter (HOSPITAL_COMMUNITY): Payer: Self-pay | Admitting: Emergency Medicine

## 2021-08-10 DIAGNOSIS — F70 Mild intellectual disabilities: Secondary | ICD-10-CM | POA: Diagnosis present

## 2021-08-10 DIAGNOSIS — X58XXXA Exposure to other specified factors, initial encounter: Secondary | ICD-10-CM | POA: Insufficient documentation

## 2021-08-10 DIAGNOSIS — G40309 Generalized idiopathic epilepsy and epileptic syndromes, not intractable, without status epilepticus: Secondary | ICD-10-CM

## 2021-08-10 DIAGNOSIS — G40209 Localization-related (focal) (partial) symptomatic epilepsy and epileptic syndromes with complex partial seizures, not intractable, without status epilepticus: Secondary | ICD-10-CM

## 2021-08-10 DIAGNOSIS — Z20822 Contact with and (suspected) exposure to covid-19: Secondary | ICD-10-CM | POA: Insufficient documentation

## 2021-08-10 DIAGNOSIS — Z79899 Other long term (current) drug therapy: Secondary | ICD-10-CM | POA: Insufficient documentation

## 2021-08-10 DIAGNOSIS — F840 Autistic disorder: Secondary | ICD-10-CM | POA: Diagnosis not present

## 2021-08-10 DIAGNOSIS — F419 Anxiety disorder, unspecified: Secondary | ICD-10-CM | POA: Diagnosis not present

## 2021-08-10 DIAGNOSIS — R4182 Altered mental status, unspecified: Secondary | ICD-10-CM | POA: Insufficient documentation

## 2021-08-10 DIAGNOSIS — T1491XA Suicide attempt, initial encounter: Secondary | ICD-10-CM | POA: Diagnosis present

## 2021-08-10 DIAGNOSIS — Y900 Blood alcohol level of less than 20 mg/100 ml: Secondary | ICD-10-CM | POA: Insufficient documentation

## 2021-08-10 DIAGNOSIS — F331 Major depressive disorder, recurrent, moderate: Secondary | ICD-10-CM | POA: Insufficient documentation

## 2021-08-10 LAB — CBC WITH DIFFERENTIAL/PLATELET
Abs Immature Granulocytes: 0.03 10*3/uL (ref 0.00–0.07)
Basophils Absolute: 0 10*3/uL (ref 0.0–0.1)
Basophils Relative: 0 %
Eosinophils Absolute: 0.1 10*3/uL (ref 0.0–0.5)
Eosinophils Relative: 2 %
HCT: 46.8 % (ref 39.0–52.0)
Hemoglobin: 16.3 g/dL (ref 13.0–17.0)
Immature Granulocytes: 0 %
Lymphocytes Relative: 29 %
Lymphs Abs: 2 10*3/uL (ref 0.7–4.0)
MCH: 31.4 pg (ref 26.0–34.0)
MCHC: 34.8 g/dL (ref 30.0–36.0)
MCV: 90.2 fL (ref 80.0–100.0)
Monocytes Absolute: 0.4 10*3/uL (ref 0.1–1.0)
Monocytes Relative: 7 %
Neutro Abs: 4.2 10*3/uL (ref 1.7–7.7)
Neutrophils Relative %: 62 %
Platelets: 293 10*3/uL (ref 150–400)
RBC: 5.19 MIL/uL (ref 4.22–5.81)
RDW: 12 % (ref 11.5–15.5)
WBC: 6.8 10*3/uL (ref 4.0–10.5)
nRBC: 0 % (ref 0.0–0.2)

## 2021-08-10 LAB — COMPREHENSIVE METABOLIC PANEL
ALT: 31 U/L (ref 0–44)
AST: 24 U/L (ref 15–41)
Albumin: 4.7 g/dL (ref 3.5–5.0)
Alkaline Phosphatase: 98 U/L (ref 38–126)
Anion gap: 6 (ref 5–15)
BUN: 9 mg/dL (ref 6–20)
CO2: 26 mmol/L (ref 22–32)
Calcium: 9.3 mg/dL (ref 8.9–10.3)
Chloride: 105 mmol/L (ref 98–111)
Creatinine, Ser: 0.99 mg/dL (ref 0.61–1.24)
GFR, Estimated: >60 mL/min (ref >60)
Glucose, Bld: 110 mg/dL — ABNORMAL HIGH (ref 70–99)
Potassium: 3.6 mmol/L (ref 3.5–5.1)
Sodium: 137 mmol/L (ref 135–145)
Total Bilirubin: 0.9 mg/dL (ref 0.3–1.2)
Total Protein: 8.1 g/dL (ref 6.5–8.1)

## 2021-08-10 LAB — RAPID URINE DRUG SCREEN, HOSP PERFORMED
Amphetamines: NOT DETECTED
Barbiturates: NOT DETECTED
Benzodiazepines: NOT DETECTED
Cocaine: NOT DETECTED
Opiates: NOT DETECTED
Tetrahydrocannabinol: NOT DETECTED

## 2021-08-10 LAB — ETHANOL: Alcohol, Ethyl (B): <10 mg/dL (ref <10)

## 2021-08-10 LAB — COOXEMETRY PANEL
Carboxyhemoglobin: 1.3 % (ref 0.5–1.5)
Methemoglobin: 1.3 % (ref 0.0–1.5)
O2 Saturation: 92.6 %
Total hemoglobin: 15.7 g/dL (ref 12.0–16.0)

## 2021-08-10 LAB — RESP PANEL BY RT-PCR (FLU A&B, COVID) ARPGX2
Influenza A by PCR: NEGATIVE
Influenza B by PCR: NEGATIVE
SARS Coronavirus 2 by RT PCR: NEGATIVE

## 2021-08-10 MED ORDER — LAMOTRIGINE 200 MG PO TABS: 400.0000 mg | ORAL_TABLET | Freq: Two times a day (BID) | ORAL | Status: DC

## 2021-08-10 MED ORDER — LAMOTRIGINE 100 MG PO TABS
400.0000 mg | ORAL_TABLET | Freq: Every morning | ORAL | Status: DC
  Administered 2021-08-10 – 2021-08-13 (×4): 400 mg via ORAL
  Filled 2021-08-10: qty 4
  Filled 2021-08-10: qty 2
  Filled 2021-08-10 (×3): qty 4
  Filled 2021-08-10: qty 2
  Filled 2021-08-10: qty 4

## 2021-08-10 MED ORDER — LAMOTRIGINE 200 MG PO TABS
500.0000 mg | ORAL_TABLET | Freq: Every evening | ORAL | Status: DC
  Filled 2021-08-10: qty 1

## 2021-08-10 MED ORDER — CLONAZEPAM 0.5 MG PO TABS
1.0000 mg | ORAL_TABLET | Freq: Three times a day (TID) | ORAL | Status: DC | PRN
  Filled 2021-08-10: qty 2

## 2021-08-10 MED ORDER — LEVETIRACETAM 500 MG PO TABS
1000.0000 mg | ORAL_TABLET | Freq: Two times a day (BID) | ORAL | Status: DC
  Administered 2021-08-10 – 2021-08-13 (×8): 1000 mg via ORAL
  Filled 2021-08-10 (×8): qty 2

## 2021-08-10 MED ORDER — MELATONIN 10 MG PO CAPS: 1.0000 | ORAL_CAPSULE | Freq: Every day | ORAL | Status: DC

## 2021-08-10 MED ORDER — MELATONIN 3 MG PO TABS
9.0000 mg | ORAL_TABLET | Freq: Every day | ORAL | Status: DC
  Administered 2021-08-10 – 2021-08-12 (×4): 9 mg via ORAL
  Filled 2021-08-10 (×3): qty 3

## 2021-08-10 MED ORDER — LAMOTRIGINE 100 MG PO TABS
500.0000 mg | ORAL_TABLET | Freq: Every evening | ORAL | Status: DC
  Administered 2021-08-10 – 2021-08-12 (×3): 500 mg via ORAL
  Filled 2021-08-10 (×6): qty 5

## 2021-08-10 NOTE — ED Notes (Signed)
[place] [employer] sign off on. Pt states not going to leave. Pt is sitting on bed side figiting

## 2021-08-10 NOTE — ED Notes (Signed)
Grandmother came to visit but patient did not want any visitors at this time.

## 2021-08-10 NOTE — ED Notes (Signed)
Grandmother, Zachary [name], returns call to ED to verify pts medication.  Grandmother reports pts mother gave him up at age 20 and has recently returned to the area. Pt went to live with her on 2 different occasions but then kicked him out of her home, most recently 3 weeks ago. Grandmother would like to speak with behavioral health regarding his condition, behavior, and current stressors.

## 2021-08-10 NOTE — BH Assessment (Signed)
Comprehensive Clinical Assessment (CCA) Note  08/10/2021 Zachary Flavinravis [name] [id number]  Disposition Pending:   Chief Complaint: 20 year old male present APEd after suicide intent via by carbon monoxide poisoning. Patient was not straight forward when asked about suicidal intent and depression. When asked if he was suicidal or depressed patient stated 'some what' but did not provide a detail explanation. Patient denied homicidal ideations, denied auditory/visual hallucinations. Denied paranoia.  Per chart review from 08/10/2021 Zachary Risingina Goodpasture, NP, stated the following after speaking with the patient's mother  I was asked to talk to Grandmother urgently by front desk staff. I spoke with Zachary [name] and she said that Zachary [name] was at [facility] because of suicidal attempt by carbon monoxide poisoning. She said that he has "not been acting right" in general. Then she said that 3 days ago he was at his father's house and was acting dazed and not himself, then later in the day he had a 2 1/2 minute convulsive seizure. She said lips turned blue and EMS was called but by the time he was assessed the seizure had ended. She said that he was not seen in ED at that time. He slept several hours after the seizure but continued to seem dazed and not himself after that, then yesterday he was found in a shed with a leaf blower running, thought to be a suicide attempt by carbon monoxide poisoning. He is at [facility] now awaiting psychiatric evaluation. Grandmother called to ask if Dr Sharene SkeansHickling would arrange for an MRI of the brain to see why he had a seizure, is acting dazed and considering suicide. I explained to grandmother that Dr Sharene SkeansHickling is not in the office today, as well as that an MRI would not be beneficial at this time. I explained that he may be acting dazed because of his mental health disturbance and that the seizure may have occurred because of the unusual emotional stress that he is currently experiencing. I  stressed to grandmother that he needs psychiatric care at this time and to comply with the ED's plan for behavioral health evaluation. Grandmother continued to ask for Dr Sharene SkeansHickling to intervene and for Zachary [name] to see Dr Sharene SkeansHickling ASAP. I explained to grandmother again that Dr Sharene SkeansHickling is not the office today and that I will relay her concerns when he returns, but that for now, Zachary [name] needs psychiatric intervention, not neurology. Grandmother asked when Zachary [name] has follow up appointment with Dr Sharene SkeansHickling and I explained that because Zachary [name] is now 20 years old and because Dr Sharene SkeansHickling is retiring in September that he was referred to an adult neurology provider. Grandmother was unhappy with that information but said that she wanted Dr Sharene SkeansHickling to specifically pick out an adult provider for him. I told her that I would share this message with Dr Sharene SkeansHickling when he returns to the office tomorrow. She had no further questions.       Chief Complaint  Patient presents with   V70.1   Visit Diagnosis: Depression    CCA Screening, Triage and Referral (STR)  Patient Reported Information How did you hear about us? Self  What Is the Reason for Your Visit/Call Today? Patient present to APEd after attempting suicide ident viaby carbon monoxide poisoning.  How Long Has This Been Causing You Problems? 1 wk - 1 month  What Do You Feel Would Help You the Most Today? Stress Management   Have You Recently Had Any Thoughts About Hurting Yourself? Yes  Are You Planning to Commit Suicide/Harm  Yourself At This time? No   Have you Recently Had Thoughts About Hurting Someone Karolee Ohs? No  Are You Planning to Harm Someone at This Time? No  Explanation: No data recorded  Have You Used Any Alcohol or Drugs in the Past 24 Hours? No  How Long Ago Did You Use Drugs or Alcohol? No data recorded What Did You Use and How Much? No data recorded  Do You Currently Have a Therapist/Psychiatrist? No data recorded Name of  Therapist/Psychiatrist: No data recorded  Have You Been Recently Discharged From Any Office Practice or Programs? No data recorded Explanation of Discharge From Practice/Program: No data recorded    CCA Screening Triage Referral Assessment Type of Contact: No data recorded Telemedicine Service Delivery:   Is this Initial or Reassessment? No data recorded Date Telepsych consult ordered in [facility]:  No data recorded Time Telepsych consult ordered in [facility]:  No data recorded Location of Assessment: No data recorded Provider Location: No data recorded  Collateral Involvement: No data recorded  Does Patient Have a Court Appointed Legal Guardian? No data recorded Name and Contact of Legal Guardian: No data recorded If Minor and Not Living with Parent(s), Who has Custody? No data recorded Is CPS involved or ever been involved? No data recorded Is APS involved or ever been involved? No data recorded  Patient Determined To Be At Risk for Harm To Self or Others Based on Review of Patient Reported Information or Presenting Complaint? No data recorded Method: No data recorded Availability of Means: No data recorded Intent: No data recorded Notification Required: No data recorded Additional Information for Danger to Others Potential: No data recorded Additional Comments for Danger to Others Potential: No data recorded Are There Guns or Other Weapons in Your Home? No data recorded Types of Guns/Weapons: No data recorded Are These Weapons Safely Secured?                            No data recorded Who Could Verify You Are Able To Have These Secured: No data recorded Do You Have any Outstanding Charges, Pending Court Dates, Parole/Probation? No data recorded Contacted To Inform of Risk of Harm To Self or Others: No data recorded   Does Patient Present under Involuntary Commitment? No data recorded IVC Papers Initial File Date: No data recorded  [place] of Residence: No data recorded  Patient  Currently Receiving the Following Services: No data recorded  Determination of Need: Emergent (2 hours)   Options For Referral: No data recorded    CCA Biopsychosocial Patient Reported Schizophrenia/Schizoaffective Diagnosis in Past: No   Strengths: watching television   Mental Health Symptoms Depression:   Worthlessness; Hopelessness; Difficulty Concentrating; Fatigue; Change in energy/activity; Sleep (too much or little)   Duration of Depressive symptoms:    Mania:   N/A   Anxiety:    Sleep (stay up at night sleep during the day)   Psychosis:   None   Duration of Psychotic symptoms:    Trauma:   N/A   Obsessions:   N/A   Compulsions:   N/A   Inattention:   Disorganized; Avoids/dislikes activities that require focus   Hyperactivity/Impulsivity:   Difficulty waiting turn; Fidgets with hands/feet; Feeling of restlessness   Oppositional/Defiant Behaviors:   N/A   Emotional Irregularity:  No data recorded  Other Mood/Personality Symptoms:  No data recorded   Mental Status Exam Appearance and self-care  Stature:   Small   Weight:  Overweight   Clothing:  No data recorded  Grooming:   Normal   Cosmetic use:   Inappropriate for age   Posture/gait:   Normal   Motor activity:  No data recorded  Sensorium  Attention:   Normal   Concentration:   Normal   Orientation:   X5   Recall/memory:  No data recorded  Affect and Mood  Affect:   Depressed   Mood:   Depressed   Relating  Eye contact:   None   Facial expression:   Depressed   Attitude toward examiner:   Cooperative   Thought and Language  Speech flow:  Normal   Thought content:   Appropriate to Mood and Circumstances   Preoccupation:   Suicide   Hallucinations:   None   Organization:  No data recorded  [employer] of Knowledge:  No data recorded  Intelligence:   Above Average   Abstraction:  No data recorded  Judgement:   Poor   Reality  Testing:   Distorted   Insight:  No data recorded  Decision Making:  No data recorded  Social Functioning  Social Maturity:  No data recorded  Social Judgement:  No data recorded  Stress  Stressors:   Family conflict   Coping Ability:   Normal   Skill Deficits:  No data recorded  Supports:  No data recorded    Religion: Religion/Spirituality Are You A Religious Person?:  (UTA)  Leisure/Recreation:    Exercise/Diet:     CCA Employment/Education Employment/Work Situation: Employment / Work Systems developer:  (Altered Mental Status)  Education:     CCA Family/Childhood History Family and Relationship History: Family history Marital status: Single (Altered Mental Status) Does patient have children?: No  Childhood History:  Childhood History By whom was/is the patient raised?: Grandparents Did patient suffer any verbal/emotional/physical/sexual abuse as a child?: No Did patient suffer from severe childhood neglect?: No Has patient ever been sexually abused/assaulted/raped as an adolescent or adult?: No Was the patient ever a victim of a crime or a disaster?: No Witnessed domestic violence?: No Has patient been affected by domestic violence as an adult?: No  Child/Adolescent Assessment:     CCA Substance Use Alcohol/Drug Use: Alcohol / Drug Use Pain Medications: see MAR Prescriptions: see MAR Over the Counter: see MAR History of alcohol / drug use?: No history of alcohol / drug abuse                         ASAM's:  Six Dimensions of Multidimensional Assessment  Dimension 1:  Acute Intoxication and/or Withdrawal Potential:      Dimension 2:  Biomedical Conditions and Complications:      Dimension 3:  Emotional, Behavioral, or Cognitive Conditions and Complications:     Dimension 4:  Readiness to Change:     Dimension 5:  Relapse, Continued use, or Continued Problem Potential:     Dimension 6:  Recovery/Living Environment:      ASAM Severity Score:    ASAM Recommended Level of Treatment:     Substance use Disorder (SUD)    Recommendations for Services/Supports/Treatments:    Discharge Disposition:    DSM5 Diagnoses: Patient Active Problem List   Diagnosis Date Noted   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, requiring support 04/13/2018   Intermittent explosive disorder 09/02/2016   Poor sleep hygiene 09/02/2016   Partial epilepsy with impairment of consciousness, not intractable (HCC) 09/02/2016  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   Psychiatric exam requested by authority    Problems with learning 08/15/2014   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   Attention deficit hyperactivity disorder (ADHD) 08/26/2013   Long-term use of high-risk medication 08/26/2013     Referrals to Alternative Service(s): Referred to Alternative Service(s):   Place:   Date:   Time:    Referred to Alternative Service(s):   Place:   Date:   Time:    Referred to Alternative Service(s):   Place:   Date:   Time:    Referred to Alternative Service(s):   Place:   Date:   Time:     Lawrence Mitch, LCAS

## 2021-08-10 NOTE — ED Notes (Signed)
Pt dressed out

## 2021-08-10 NOTE — ED Notes (Signed)
Attempted to reconcile pts medications. Pt unsure of name of his medications and requested this nurse call grandmother and ask her. This nurse called the number listed in demographics for pts grandmother and received a non-name identified voicemail, this nurse left a generic message requesting a call back to [facility] ED.

## 2021-08-10 NOTE — ED Provider Notes (Signed)
[facility] EMERGENCY DEPARTMENT Provider Note   CSN: [id number] Arrival date & time: 08/10/21  0000     History Chief Complaint  Patient presents with   V70.1    Zachary [name] is a 20 y.o. male.  The history is provided by the patient and the police.  Mental Health Problem Presenting symptoms: suicide attempt   Patient accompanied by:  Law enforcement Degree of incapacity (severity):  Moderate Onset quality:  Sudden Timing:  Constant Chronicity:  New Relieved by:  Nothing Worsened by:  Nothing Associated symptoms: anxiety   Associated symptoms: no abdominal pain, no chest pain and no headaches   Patient with history of ADHD, seizures presents with concern for suicide attempt.  It is reported the patient was found in a closed building with a leaf blower running for an unknown period of time Patient denies any complaints at this time.  Denies headache/dizziness/weakness.  Patient reports he was testing leaf blower and it did not run very long.  However patient was brought to the ER yesterday for suicidal thoughts but left before evaluate     Past Medical History:  Diagnosis Date   ADHD (attention deficit hyperactivity disorder)    Autism    MDD (major depressive disorder)    Post traumatic stress disorder (PTSD)    Seasonal allergies    Seizures (HCC)    Social anxiety disorder of childhood     Patient Active Problem List   Diagnosis Date Noted   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, requiring support 04/13/2018   Intermittent explosive disorder 09/02/2016   Poor sleep hygiene 09/02/2016   Partial epilepsy with impairment of consciousness, not intractable (HCC) 09/02/2016   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   Psychiatric exam requested by authority    Problems with learning 08/15/2014   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   Attention  deficit hyperactivity disorder (ADHD) 08/26/2013   Long-term use of high-risk medication 08/26/2013    Past Surgical History:  Procedure Laterality Date   CIRCUMCISION  2002       Family History  Problem Relation Age of Onset   Other Mother        Slow Learner   Seizures Mother        Had Seizures Due to Head Trauma   Other Father        Slow Learner   Asthma Sister    Seizures Maternal Grandmother        Generalized Seizures   Heart attack Maternal Grandfather        Died at 51    Social History   Tobacco Use   Smoking status: Never   Smokeless tobacco: Never  Vaping Use   Vaping Use: Every day  Substance Use Topics   Alcohol use: No   Drug use: No    Home Medications Prior to Admission medications   Medication Sig Start Date End Date Taking? Authorizing Provider  acetaminophen (TYLENOL) 500 MG tablet Take 500 mg by mouth every 6 (six) hours as needed for mild pain.    [provider]  amantadine (SYMMETREL) 100 MG capsule TAKE 1 CAPSULE BY MOUTH TWICE A DAY 03/18/21   Deetta Perla, MD  clonazePAM (KLONOPIN) 0.5 MG tablet TAKE 2 AND 1/2 TABLETS BY MOUTH AT BEDTIME 03/18/21   Deetta Perla, MD  lamoTRIgine (LAMICTAL) 100 MG tablet Take 4 tablets in the morning and 5 tablets at nighttime 03/18/21  Deetta Perla, MD  levETIRAcetam (KEPPRA) 500 MG tablet TAKE 2 TABLETS BY MOUTH TWICE A DAY 03/18/21   Deetta Perla, MD  Melatonin 10 MG CAPS Take 1 capsule by mouth at bedtime.    [provider]    Allergies    Strawberry extract  Review of Systems   Review of Systems  Constitutional:  Negative for fever.  Respiratory:  Negative for shortness of breath.   Cardiovascular:  Negative for chest pain.  Gastrointestinal:  Negative for abdominal pain.  Neurological:  Negative for dizziness, weakness, numbness and headaches.  Psychiatric/Behavioral:  The patient is nervous/anxious.   All other systems reviewed and are  negative.  Physical Exam Updated Vital Signs BP (!) 141/82   Pulse 100   Temp 98.7 F (37.1 C)   Resp 20   Ht 1.765 m (5' 9.5")   Wt 83 kg   SpO2 96%   BMI 26.63 kg/m   Physical Exam CONSTITUTIONAL: Well developed/well nourished, makes poor eye contact HEAD: Normocephalic/atraumatic EYES: EOMI NECK: supple no meningeal signs SPINE/BACK:entire spine nontender CV: S1/S2 noted, no murmurs/rubs/gallops noted LUNGS: Lungs are clear to auscultation bilaterally, no apparent distress ABDOMEN: soft, nontender NEURO: Pt is awake/alert/appropriate, moves all extremitiesx4.  No facial droop.   EXTREMITIES: pulses normal/equal, full ROM, extremities and ankles SKIN: warm, color normal PSYCH: Anxious and makes poor eye contact  ED Results / Procedures / Treatments   Labs (all labs ordered are listed, but only abnormal results are displayed) Labs Reviewed  COMPREHENSIVE METABOLIC PANEL - Abnormal; Notable for the following components:      Result Value   Glucose, Bld 110 (*)    All other components within normal limits  RESP PANEL BY RT-PCR (FLU A&B, COVID) ARPGX2  CBC WITH DIFFERENTIAL/PLATELET  ETHANOL  COOXEMETRY PANEL  RAPID URINE DRUG SCREEN, HOSP PERFORMED    EKG None  Radiology No results found.  Procedures Procedures   Medications Ordered in ED Medications  clonazePAM (KLONOPIN) tablet 1 mg (has no administration in time range)  lamoTRIgine (LAMICTAL) tablet 400 mg (has no administration in time range)  levETIRAcetam (KEPPRA) tablet 1,000 mg (has no administration in time range)  Melatonin CAPS 1 capsule (has no administration in time range)    ED Course  I have reviewed the triage vital signs and the nursing notes.  Pertinent labs results that were available during my care of the patient were reviewed by me and considered in my medical decision making (see chart for details).    MDM Rules/Calculators/A&P                           Patient presents after  he was found in a building with a leaf blower running.  This is a presumed suicide attempt with carbon monoxide.  Patient denies any complaints such as headache or dizziness.  He appears anxious but is otherwise no acute distress.  Family had recently brought him to the ER for suicidal ideation but left Patient agrees to be evaluated by behavioral health 3:21 AM Labs overall reassuring.  No signs of carbon monoxide poisoning Patient medically stable at this time  The patient has been placed in psychiatric observation due to the need to provide a safe environment for the patient while obtaining psychiatric consultation and evaluation, as well as ongoing medical and medication management to treat the patient's condition.  The patient has not been placed under full IVC at this time.  Final Clinical Impression(s) / ED Diagnoses Final diagnoses:  Suicide attempt [facility])    Rx / DC Orders ED Discharge Orders     None        [name], MD 08/10/21 [phone]

## 2021-08-10 NOTE — ED Notes (Signed)
Pt up to bathroom, Urine collected and placed on RNs desk

## 2021-08-10 NOTE — Progress Notes (Signed)
Inpatient Behavioral Health Placement   Per shift report; pt meets inpatient criteria per Clydie Braun, NP. CSW sent a secure chat for Fransico Michael, RN, [facility] to review for [facility] and CSW sent referral out to outside providers. Referral was sent to the following facilities:  Destination Service Provider Address Phone Fax  [facility]  [address] [place] [place] [address] [phone]  [facility]  [address] [place] [address] [phone]  [facility]  [address], [place] [address] [phone]  [facility]  [address] [place] [address] [phone]  [facility]  [address] [place] [address] [phone]  [facility]  [address], [place] [address] [phone]  [facility]  [address] [place], [place] [address] [phone]  [facility]  [address] [place], [place] [address] [phone]  [facility]  [address] [place] [address] [phone]  [facility]  [address] [place] [address] [phone]      Situation ongoing,  CSW will follow up.   Maryjean Ka, MSW, LCSWA 08/10/2021  @ 9:00 PM

## 2021-08-10 NOTE — Telephone Encounter (Signed)
I was asked to talk to Grandmother urgently by front desk staff. I spoke with Mrs. Zachary [name] and she said that Zachary [name] was at [facility] ED because of suicidal attempt by carbon monoxide poisoning. She said that he has "not been acting right" in general. Then she said that 3 days ago he was at his father's house and was acting dazed and not himself, then later in the day he had a 2 1/2 minute convulsive seizure. She said lips turned blue and EMS was called but by the time he was assessed the seizure had ended. She said that he was not seen in ED at that time. He slept several hours after the seizure but continued to seem dazed and not himself after that, then yesterday he was found in a shed with a leaf blower running, thought to be a suicide attempt by carbon monoxide poisoning. He is at [facility] now awaiting psychiatric evaluation. Grandmother called to ask if Dr Sharene Skeans would arrange for an MRI of the brain to see why he had a seizure, is acting dazed and considering suicide. I explained to grandmother that Dr Sharene Skeans is not in the office today, as well as that an MRI would not be beneficial at this time. I explained that he may be acting dazed because of his mental health disturbance and that the seizure may have occurred because of the unusual emotional stress that he is currently experiencing. I stressed to grandmother that he needs psychiatric care at this time and to comply with the ED's plan for behavioral health evaluation. Grandmother continued to ask for Dr Sharene Skeans to intervene and for [name] to see Dr Sharene Skeans ASAP. I explained to grandmother again that Dr Sharene Skeans is not the office today and that I will relay her concerns when he returns, but that for now, [name] needs psychiatric intervention, not neurology. Grandmother asked when [name] has follow up appointment with Dr Sharene Skeans and I explained that because [name] is now 20 years old and because Dr Sharene Skeans is retiring in September that he was  referred to an adult neurology provider. Grandmother was unhappy with that information but said that she wanted Dr Sharene Skeans to specifically pick out an adult provider for him. I told her that I would share this message with Dr Sharene Skeans when he returns to the office tomorrow. She had no further questions. TG

## 2021-08-10 NOTE — Telephone Encounter (Signed)
I called [facility]. They said that they received the referral but that they were not able to contact the patient so they closed the referral. She suggested that you put the referral in again with a note to contact grandmother to schedule. Inetta Fermo

## 2021-08-10 NOTE — BHH Counselor (Signed)
Disposition: Clydie Braun, NP, patient is recommended for inpatient tx

## 2021-08-10 NOTE — ED Triage Notes (Signed)
Per was in building with leaf blower running per [place] [employer]. Pt denies any SI/Hi ideations.

## 2021-08-10 NOTE — ED Notes (Signed)
Pt got up and ran out of the ED dept through the EMS doors.  Pt states he wants to go home.  Pt smiling while trying to assist pt back in the dept.  Pt uncooperative but able to escort pt back in the dept.  EDP made aware of pt leaving and informed that pt is not IVC'd.

## 2021-08-10 NOTE — Telephone Encounter (Signed)
Glenden was seen in the office March 18, 2021.  I am not at all certain that the adult who was with him was his mother, it could have been his grandmother.  At that time he was seizure-free.  I was concerned about his weight gain and his poor sleep hygiene.  I made it clear at that visit that he was going to be transferred to an adult neurologist.  On June 17, I made a referral to [facility].  Looking in the appointment section, I cannot see that there has been any request by them to have him seen.  I agree with the advice that you gave grandmother.  There is clearly psychiatric disorder that needs to be addressed.  She has poor sleep hygiene and in the past his erratic compliance with medical regimen could be two other reasons why he seems to be dazed and distant.  While I would be willing to see him one more time, it is time for him to make a transition to adult care.  I only hope that our colleagues will be willing to accept him understanding that we are not expecting them to provide psychiatric support.  I wonder if you can check with [facility] to find out if they have made an attempt to contact the family.  Thank you

## 2021-08-10 NOTE — ED Notes (Signed)
Grandmother requested nurse to come to waiting room, she had information for staff. Grandmother gave medication list:  Lamotrigine  400 mg in AM and 500 mg in PM Levetiracetam 1000 mg AM and PM Amantadine 100 mg AM and PM Multivitamin daily Klonopin 1.25 mg at bedtime Melatonin 5 mg at bedtime

## 2021-08-11 DIAGNOSIS — F4310 Post-traumatic stress disorder, unspecified: Secondary | ICD-10-CM | POA: Insufficient documentation

## 2021-08-11 NOTE — Telephone Encounter (Signed)
I resent the request for transfer care.

## 2021-08-11 NOTE — ED Notes (Signed)
Pt's dinner tray has arrived. Pt sitting up and eating his dinner. Will continue to monitor pt.

## 2021-08-11 NOTE — ED Notes (Signed)
Pt's breakfast tray has arrived. Pt sitting up and eating his breakfast good. Pt stated that they did not want his milk, milk in refrigerator. Will continue to monitor pt.

## 2021-08-11 NOTE — ED Notes (Signed)
Pt had not had lunch and has been cooperative throughout the entire shift of this Clinical research associate. Writer asked RN if pt could have a chocolate snack due to his good behavior and lack of appetite for his egg sandwich for lunch. RN allowed chocolate, pt appreciative of it. Pt calm and cooperative, will continue to monitor pt

## 2021-08-11 NOTE — ED Notes (Signed)
Pt's lunch has arrived, pt stated that they did not want their lunch nor liked the option. This Clinical research associate made sure to see if we had extra sandwiches for pt, but we are not stocked. Pt aware. Lunhch tray near nurses station. Will continue to monitor pt.

## 2021-08-11 NOTE — ED Notes (Signed)
Pt is calm and cooperative.  resp even and unlabored.  Skin warm and dry.  Tele sitter in room

## 2021-08-11 NOTE — ED Provider Notes (Signed)
Emergency Medicine Observation Re-evaluation Note  Shaiden Aldous is a 20 y.o. male, seen on rounds today.  Pt initially presented to the ED for complaints of V70.1 Currently, the patient is resting, seemingly comfortably.  Physical Exam  BP 114/83 (BP Location: Right Arm)   Pulse 63   Temp 98 F (36.7 C) (Oral)   Resp 16   Ht 5' 9.5" (1.765 m)   Wt 83 kg   SpO2 100%   BMI 26.63 kg/m  Physical Exam General: No distress Cardiac: Regular rate and rhythm Lungs: No increased work of breathing Psych: No new complaints, patient yesterday stated that he wanted to go home, but was directable to continue receiving intervention from behavioral health.  ED Course / MDM  EKG:   I have reviewed the labs performed to date as well as medications administered while in observation.  Recent changes in the last 24 hours include none.  Plan  Current plan is for placement.  Biff Rutigliano is not under involuntary commitment.     Gerhard Munch, MD 08/11/21 1001

## 2021-08-11 NOTE — Progress Notes (Signed)
Patient has been faxed out under the recommendation of Dr. Lucianne Muss. Patient meets inpatient criteria per Earleen Reaper. Patient referred to the following facilities:  [facility]  [address], [place] [address] [phone]  [facility]  [address] [place] [address] [phone]  [facility]  [address] [place] [address] [phone]  [facility]  [address] [place] [address] [phone]  [facility]  [address] [place] [address] [phone]  [facility]  [address], [place] [address] [phone]  [facility]  [address] [place], [place] [address] [phone]  [facility]  [address] [place], [place] [address] [phone]  [facility]  [address] [place] [place] [address] [phone]  [facility]  [address] [place] [address] [phone]    CSW will continue to monitor disposition.    Damita Dunnings, MSW, LCSW-A  9:48 AM 08/11/2021

## 2021-08-11 NOTE — Progress Notes (Addendum)
CSW called [facility] [phone] at 9:41pm and spoke with intake who confirmed that they have received pt's referral however a decision has yet to be made. CSW will assist and follow for bed placement.  Maryjean Ka, MSW, [facility] 08/11/2021 11:21 PM

## 2021-08-11 NOTE — ED Notes (Signed)
Pt's grandmother arrived to see pt. Pt was asleep throughout the entire time. Pt's grandmother only stayed for 2 minutes due to pt asleep and wanting to allow pt to rest. Pt's grandmother has requested to be updated whenever pt is up for discharge to another facility so she can see him before he leaves. RN Nehemiah Settle and RN Marylene Land spoke to pt's grandmother and are aware of this. Will continue to monitor pt.

## 2021-08-12 NOTE — Progress Notes (Signed)
Pt accepted to [facility]      Patient meets inpatient criteria per Dorena Bodo, NP  Dr.Thomas Loyola Mast is the attending provider.    Call report to [phone] or [phone]  Megan Salon, RN @ [facility] ED notified.     Pt scheduled  to arrive at [facility] TOMORROW AFTER 0800.   Damita Dunnings, MSW, LCSW-A  1:16 PM 08/12/2021

## 2021-08-13 DIAGNOSIS — F331 Major depressive disorder, recurrent, moderate: Secondary | ICD-10-CM

## 2021-08-13 DIAGNOSIS — F70 Mild intellectual disabilities: Secondary | ICD-10-CM

## 2021-08-13 NOTE — Consult Note (Addendum)
[facility] Psych ED Discharge  08/13/2021 11:46 AM Zachary [name]  MRN:  [id number]  Method of visit?: Virtual (Location of provider: [facility] Location of patient: Zachary [name] ED Names and roles of anyone participating in the consult/assessment: Zachary [name] and Zachary [name] This service was provided via telemedicine using a 2-way, interactive audio, and video technology. )  Principal Problem: Mild intellectual disability Discharge Diagnoses: Principal Problem:   Mild intellectual disability Active Problems:   MDD (major depressive disorder), recurrent episode, moderate (HCC)   Subjective: Patient is seen and reassessed by this nurse practitioner.  His grandmother Zachary [name], is also present in addition to Publishing copy for emergency room.  Patient is alert and oriented, calm and cooperative, and very pleasant shows willingness to engage with both nurse practitioners.  When assessing for reason for being present in the hospital patient reports" I turned on the leaf blower, while I was in the garage.  It was nighttime and I did not want to wake the neighbors, so I came in crying get up outside."  When assessing for danger and risks, patient again identified correctly all risks associated with turning on a Leaf blower while in a closed and area.  He adamantly denies any attempt at suicidality.  He continues to refuse any previous suicide attempts, self-harm behaviors, and or suicidal thoughts.  Patient is being seen by outpatient psychiatry, his grandmother is attempting to become his legal guardian.  She continues to refuse for him to go inpatient psychiatric at [facility].  She is able to verbalize x2 that she takes full responsibility for this patient in the event he harm to self or someone else.  She states she has been calling multiple providers, to establish this psychiatric resources in the community.  Patient continues to deny suicidal ideations, homicidal ideations, and or auditory or  visual hallucinations.  Grandmother is educated about obtaining legal guardianship, and she is concerned that patient does not have competency to make decisions.  She again verbalizes understanding.   Zachary [name]   Total Time spent with patient: 30 minutes  Past Psychiatric History: ADHD, autism, major depressive disorder, posttraumatic stress disorder, and social anxiety.  Patient is currently taking Lamictal 400 mg p.o. twice daily, Klonopin 0.5 mg p.o. 3 times daily as needed for anxiety, and an amantadine 100 p.o. twice daily.   Past Medical History:  Past Medical History:  Diagnosis Date   ADHD (attention deficit hyperactivity disorder)    Autism    MDD (major depressive disorder)    Post traumatic stress disorder (PTSD)    Seasonal allergies    Seizures (HCC)    Social anxiety disorder of childhood     Past Surgical History:  Procedure Laterality Date   CIRCUMCISION  2002   Family History:  Family History  Problem Relation Age of Onset   Other Mother        Slow Learner   Seizures Mother        Had Seizures Due to Head Trauma   Other Father        Slow Learner   Asthma Sister    Seizures Maternal Grandmother        Generalized Seizures   Heart attack Maternal Grandfather        Died at 76   Family Psychiatric  History: Denies Social History:  Social History   Substance and Sexual Activity  Alcohol Use No     Social History   Substance and Sexual Activity  Drug Use No    Social History   Socioeconomic History   Marital status: Single    Spouse name: Not on file   Number of children: Not on file   Years of education: Not on file   Highest education level: Not on file  Occupational History   Not on file  Tobacco Use   Smoking status: Never   Smokeless tobacco: Never  Vaping Use   Vaping Use: Every day  Substance and Sexual Activity   Alcohol use: No   Drug use: No   Sexual activity: Never  Other Topics Concern   Not on file  Social  History Narrative   Zachary [name] is no longer in school.   He lives with his mother.   He enjoys working with his hands, mowing, weed eating, drawing, and playing on his phone.   Social Determinants of Health   Financial Resource Strain: Not on file  Food Insecurity: Not on file  Transportation Needs: Not on file  Physical Activity: Not on file  Stress: Not on file  Social Connections: Not on file    Tobacco Cessation:  N/A, patient does not currently use tobacco products  Current Medications: Current Facility-Administered Medications  Medication Dose Route Frequency Provider Last Rate Last Admin   clonazePAM (KLONOPIN) tablet 1 mg  1 mg Oral TID PRN [name], MD       lamoTRIgine (LAMICTAL) tablet 400 mg  400 mg Oral q AM [name], MD   400 mg at 08/13/21 0850   lamoTRIgine (LAMICTAL) tablet 500 mg  500 mg Oral QPM Eber Hong, MD   500 mg at 08/12/21 2204   levETIRAcetam (KEPPRA) tablet 1,000 mg  1,000 mg Oral BID [name], MD   1,000 mg at 08/13/21 0850   melatonin tablet 9 mg  9 mg Oral QHS [name], MD   9 mg at 08/12/21 2204   Current Outpatient Medications  Medication Sig Dispense Refill   acetaminophen (TYLENOL) 500 MG tablet Take 500 mg by mouth every 6 (six) hours as needed for mild pain.     amantadine (SYMMETREL) 100 MG capsule TAKE 1 CAPSULE BY MOUTH TWICE A DAY 62 capsule 5   clonazePAM (KLONOPIN) 0.5 MG tablet TAKE 2 AND 1/2 TABLETS BY MOUTH AT BEDTIME 80 tablet 4   lamoTRIgine (LAMICTAL) 100 MG tablet Take 4 tablets in the morning and 5 tablets at nighttime 280 tablet 5   levETIRAcetam (KEPPRA) 500 MG tablet TAKE 2 TABLETS BY MOUTH TWICE A DAY 124 tablet 5   Melatonin 10 MG CAPS Take 1 capsule by mouth at bedtime.     PTA Medications: (Not in a hospital admission)   Musculoskeletal: Strength & Muscle Tone: within normal limits Gait & Station: normal Patient leans: N/A  Psychiatric Specialty Exam:  Presentation  General  Appearance:  Appropriate for Environment; Casual Eye Contact: Fair Speech: Clear and Coherent; Normal Rate Speech Volume: Normal Handedness: Right  Mood and Affect  Mood: Euthymic Affect: Appropriate; Congruent  Thought Process  Thought Processes: Coherent; Linear Descriptions of Associations:Intact Orientation:Full (Time, Place and Person) Thought Content:Logical History of Schizophrenia/Schizoaffective disorder:No  Duration of Psychotic Symptoms:No data recorded Hallucinations:Hallucinations: None Ideas of Reference:None Suicidal Thoughts:Suicidal Thoughts: No Homicidal Thoughts:Homicidal Thoughts: No  Sensorium  Memory: Immediate Good; Recent Good; Remote Fair Judgment: Fair Insight: Shallow  Executive Functions  Concentration: Fair Attention Span: Fair Recall: Fair Fund of Knowledge: Fair Language: Fair  Psychomotor Activity  Psychomotor Activity: Psychomotor Activity: Normal (stimming)  Assets  Assets: Health and safety inspector; Manufacturing systems engineer; Leisure Time; Physical Health  Sleep  Sleep: Sleep: Fair   Physical Exam: Physical Exam Vitals and nursing note reviewed. Exam conducted with a chaperone present.  Constitutional:      Appearance: Normal appearance. He is normal weight.  Neurological:     Mental Status: He is alert.  Psychiatric:        Mood and Affect: Mood normal.        Behavior: Behavior normal.        Thought Content: Thought content normal.        Judgment: Judgment normal.   Review of Systems  Psychiatric/Behavioral: Negative.    All other systems reviewed and are negative. Blood pressure 136/87, pulse 67, temperature 98.7 F (37.1 C), temperature source Oral, resp. rate 18, height 5' 9.5" (1.765 m), weight 83 kg, SpO2 100 %. Body mass index is 26.63 kg/m.   Demographic Factors:  Male, Adolescent or young adult, Caucasian, and Unemployed  Loss Factors: Loss of significant relationship and Financial  problems/change in socioeconomic status  Historical Factors: Impulsivity  Risk Reduction Factors:   Sense of responsibility to family, Religious beliefs about death, Living with another person, especially a relative, Positive social support, Positive therapeutic relationship, and Positive coping skills or problem solving skills  Continued Clinical Symptoms:  More than one psychiatric diagnosis Unstable or Poor Therapeutic Relationship Previous Psychiatric Diagnoses and Treatments Medical Diagnoses and Treatments/Surgeries  Cognitive Features That Contribute To Risk:  None    Suicide Risk:  Minimal: No identifiable suicidal ideation.  Patients presenting with no risk factors but with morbid ruminations; may be classified as minimal risk based on the severity of the depressive symptoms    Plan Of Care/Follow-up recommendations:  Other:  Continue current medications. Grandmother to follow up with outpatient resources and day programs.   Disposition: Psych cleared. Denies suicidal ideation, and or suicidal intent when he cut on the leaf blower in the garage.  Maryagnes Amos, FNP 08/13/2021, 11:46 AM

## 2021-08-13 NOTE — ED Notes (Addendum)
Pt grandmother came to visit pt.  Grandmother approached nurses station asking to have a supervisor to [employer] POA paperwork. Charge nurse and this RN stated we will discuss with supervisor.   This RN proceeded to explain to the grandmother that the patient was accepted to [facility] and will be transferred today around 0900. She stated " no he will not, I will not allow that. He does not need to go that far from his family and I have my mother at home with hospice."  I attempted to explain the process of [facility] and grandmother stated she wanted to speak with [facility].  I contact Teneka LCSW and had her speak with the grandmother. Awaiting further disposition from LCSW.   Pt cooperative at this time.

## 2021-08-13 NOTE — Discharge Instructions (Signed)
Patient has been recommended to continue with community based mental health services and can seek services that consist of both medication management and outpatient therapy services:   [facility]  [address]  [place], [place] [address] [phone]  [facility]  [address]  [place], [place] [address] [phone]  [facility]  [address] [place], [place] [address] [phone]

## 2021-08-13 NOTE — ED Notes (Signed)
RN asked pt if he was going to follow up with the outpatient resources we provided. Pt agreed and will be discharged with his grandmother

## 2021-08-13 NOTE — ED Notes (Signed)
Attempted to call report to [facility], spoke with Joyice Faster who reports nurses to not arrive until around 8 am and to call back then to give report.

## 2021-08-13 NOTE — ED Provider Notes (Signed)
Patient seen by behavioral team and cleared for continued outpatient care. Advised return for worsening symptoms fevers cough vomiting, otherwise advised close follow-up with their psychiatry team within the next week.   Cheryll Cockayne, MD 08/13/21 1005

## 2021-08-13 NOTE — ED Notes (Signed)
RN went into explain to grandmother that we will not be witnessing nor notarizing the [employer] POA paperwork.  Grandmother explained that the patient is not able to make decision or speak for himself.  Grandmother also stated " I dont care what you are doing, you are not taking him to [place]. I will fight you and you will have to get the police"

## 2021-08-13 NOTE — ED Notes (Signed)
Pt grandmother [name] called, requesting to speak to [place].  Informed her I would call her back when I was in his room.

## 2021-08-13 NOTE — ED Notes (Signed)
Pt sleeping- will obtain vitals when pt wakes up

## 2021-08-13 NOTE — ED Notes (Signed)
Attempted to call grandmother back on line she called from, no answer and unable to leave a message.  Attempted X 3

## 2021-08-17 ENCOUNTER — Other Ambulatory Visit: Payer: Self-pay

## 2021-08-17 ENCOUNTER — Encounter (HOSPITAL_COMMUNITY): Payer: Self-pay | Admitting: Emergency Medicine

## 2021-08-17 ENCOUNTER — Emergency Department (HOSPITAL_COMMUNITY)
Admission: EM | Admit: 2021-08-17 | Discharge: 2021-08-17 | Disposition: A | Discharge status: Home / Self Care | Payer: Medicaid Other | Attending: Emergency Medicine | Admitting: Emergency Medicine

## 2021-08-17 DIAGNOSIS — M25519 Pain in unspecified shoulder: Secondary | ICD-10-CM | POA: Insufficient documentation

## 2021-08-17 DIAGNOSIS — M549 Dorsalgia, unspecified: Secondary | ICD-10-CM | POA: Diagnosis not present

## 2021-08-17 DIAGNOSIS — F840 Autistic disorder: Secondary | ICD-10-CM | POA: Diagnosis not present

## 2021-08-17 DIAGNOSIS — F70 Mild intellectual disabilities: Secondary | ICD-10-CM | POA: Insufficient documentation

## 2021-08-17 DIAGNOSIS — F331 Major depressive disorder, recurrent, moderate: Secondary | ICD-10-CM | POA: Insufficient documentation

## 2021-08-17 DIAGNOSIS — Z008 Encounter for other general examination: Secondary | ICD-10-CM | POA: Diagnosis present

## 2021-08-17 NOTE — Consult Note (Signed)
Telepsych Consultation   Reason for Consult:  psychiatric evaluation Referring Physician:  Dr. Rubin Payor, EDP Location of Patient: [facility] Location of Provider: [facility]  Patient Identification: Zachary [name] MRN:  [id number] Principal Diagnosis: <principal problem not specified> Diagnosis:  Active Problems:   * No active hospital problems. *   Total Time spent with patient: 20 minutes  Subjective:   Zachary [name] is a 20 y.o. male patient admitted with Zachary [name] is a 20 y.o. year-old male with a history of autism, seizures presenting to the ED with chief complaint of ingestion.   Patient is concerned that he might have overdosed on his home medications.  He does not remember how much he took.  He does not remember taking his medications.  Does not know if he took the normal amount or if he took too much or if he took too little.  He is endorsing back and shoulder pain for several weeks, unchanged this evening.  Otherwise he has no complaints, no confusion or excessive sleepiness, no nausea vomiting, no abdominal pain, no chest pain or shortness of breath.  HPI:  Today, patient seen by this provider via tele psych. Reports he may have taken too much medications after being out late last night with his friends. Sitting calmly in ED room, cooperative with interview. Denies suicidal ideation, no intent and no plans to overdose. Denies homicidal ideation, denies visual hallucinations, endorses hearing voices. Does not understand what the voices say. Able to contract for safety. Lives with grandmother, [name] and wants to go home today. Sleeps good, appetite good. No safety concerns. He did not take too much medication intentionally and not even sure if he did take too much. Has never wanted to kill himself.    Past Psychiatric History: ASD  Risk to Self:   Risk to Others:   Prior Inpatient Therapy:   Prior Outpatient Therapy:    Past Medical History:  Past Medical  History:  Diagnosis Date   ADHD (attention deficit hyperactivity disorder)    Autism    MDD (major depressive disorder)    Post traumatic stress disorder (PTSD)    Seasonal allergies    Seizures (HCC)    Social anxiety disorder of childhood     Past Surgical History:  Procedure Laterality Date   CIRCUMCISION  2002   Family History:  Family History  Problem Relation Age of Onset   Other Mother        Slow Learner   Seizures Mother        Had Seizures Due to Head Trauma   Other Father        Slow Learner   Asthma Sister    Seizures Maternal Grandmother        Generalized Seizures   Heart attack Maternal Grandfather        Died at 78   Family Psychiatric  History: unknown Social History:  Social History   Substance and Sexual Activity  Alcohol Use No     Social History   Substance and Sexual Activity  Drug Use No    Social History   Socioeconomic History   Marital status: Single    Spouse name: Not on file   Number of children: Not on file   Years of education: Not on file   Highest education level: Not on file  Occupational History   Not on file  Tobacco Use   Smoking status: Never   Smokeless tobacco: Never  Vaping Use   Vaping  Use: Every day  Substance and Sexual Activity   Alcohol use: No   Drug use: No   Sexual activity: Never  Other Topics Concern   Not on file  Social History Narrative   Alyas is no longer in school.   He lives with his mother.   He enjoys working with his hands, mowing, weed eating, drawing, and playing on his phone.   Social Determinants of Health   Financial Resource Strain: Not on file  Food Insecurity: Not on file  Transportation Needs: Not on file  Physical Activity: Not on file  Stress: Not on file  Social Connections: Not on file   Additional Social History:    Allergies:   Allergies  Allergen Reactions   Strawberry Extract Rash    Labs: No results found for this or any previous visit (from the past 48  hour(s)).  Medications:  No current facility-administered medications for this encounter.   Current Outpatient Medications  Medication Sig Dispense Refill   acetaminophen (TYLENOL) 500 MG tablet Take 500 mg by mouth every 6 (six) hours as needed for mild pain.     amantadine (SYMMETREL) 100 MG capsule TAKE 1 CAPSULE BY MOUTH TWICE A DAY 62 capsule 5   clonazePAM (KLONOPIN) 0.5 MG tablet TAKE 2 AND 1/2 TABLETS BY MOUTH AT BEDTIME 80 tablet 4   lamoTRIgine (LAMICTAL) 100 MG tablet Take 4 tablets in the morning and 5 tablets at nighttime 280 tablet 5   levETIRAcetam (KEPPRA) 500 MG tablet TAKE 2 TABLETS BY MOUTH TWICE A DAY 124 tablet 5   Melatonin 10 MG CAPS Take 1 capsule by mouth at bedtime.      Musculoskeletal: Strength & Muscle Tone: within normal limits Gait & Station: normal Patient leans: N/A          Psychiatric Specialty Exam:  Presentation  General Appearance: Appropriate for Environment  Eye Contact:Fair  Speech:Clear and Coherent  Speech Volume:Normal  Handedness:Right   Mood and Affect  Mood:Euthymic  Affect:Appropriate   Thought Process  Thought Processes:Coherent  Descriptions of Associations:Intact  Orientation:Full (Time, Place and Person)  Thought Content:Logical  History of Schizophrenia/Schizoaffective disorder:No  Duration of Psychotic Symptoms:No data recorded Hallucinations:Hallucinations: Auditory Description of Auditory Hallucinations: hears voices -- does not understand what they say Ideas of Reference:None  Suicidal Thoughts:Suicidal Thoughts: No Homicidal Thoughts:Homicidal Thoughts: No  Sensorium  Memory:Immediate Good; Recent Good; Remote Good  Judgment:Good  Insight:Shallow   Executive Functions  Concentration:Fair  Attention Span:Fair  Recall:Fair  Fund of Knowledge:Fair  Language:Fair   Psychomotor Activity  Psychomotor Activity: Psychomotor Activity: Normal  Assets  Assets:Financial  Resources/Insurance; Housing; Social Support; Transportation   Sleep  Sleep: Sleep: Good   Physical Exam: Physical Exam Vitals reviewed.  Cardiovascular:     Rate and Rhythm: Normal rate.  Pulmonary:     Effort: Pulmonary effort is normal.  Musculoskeletal:        General: Normal range of motion.  Neurological:     Mental Status: He is alert and oriented to person, place, and time.  Psychiatric:        Attention and Perception: Attention normal.        Mood and Affect: Mood normal.        Speech: Speech normal.        Behavior: Behavior is cooperative.        Thought Content: Thought content is not paranoid or delusional. Thought content does not include homicidal or suicidal ideation. Thought content does not include homicidal or  suicidal plan.        Cognition and Memory: Cognition is impaired.   Review of Systems  Constitutional:  Negative for chills and fever.  Respiratory:  Negative for shortness of breath.   Cardiovascular:  Negative for chest pain.  Gastrointestinal:  Negative for abdominal pain.  Neurological:  Negative for headaches.  Psychiatric/Behavioral:  Positive for hallucinations (? hears voices). Negative for depression, memory loss, substance abuse and suicidal ideas. The patient is not nervous/anxious and does not have insomnia.   Blood pressure 119/77, pulse 66, temperature 98.1 F (36.7 C), temperature source Oral, resp. rate 16, height 5' 9.5" (1.765 m), weight 83 kg, SpO2 100 %. Body mass index is 26.63 kg/m.  Treatment Plan Summary: Plan Safe to discharge back to grandmothers home.   Disposition: No evidence of imminent risk to self or others at present.   Patient does not meet criteria for psychiatric inpatient admission. Supportive therapy provided about ongoing stressors. Discussed crisis plan, support from social network, calling 911, coming to the Emergency Department, and calling Suicide Hotline. Psychiatrically cleared. No safety concerns,  safety planning reviewed. Recommend to patient that he get a pill organizer system to avoid future mistakes with medications.   ED staff notified via secure chat.   This service was provided via telemedicine using a 2-way, interactive audio and video technology.  Names of all persons participating in this telemedicine service and their role in this encounter. Name: [name] Role: patient  Name: Dorena Bodo Role: NP  Name: Dr. Lucianne Muss Role: psychiatrist    Novella Olive, NP 08/17/2021 2:22 PM

## 2021-08-17 NOTE — ED Provider Notes (Signed)
  Physical Exam  BP 119/77   Pulse 66   Temp 98.1 F (36.7 C) (Oral)   Resp 16   Ht 5' 9.5" (1.765 m)   Wt 83 kg   SpO2 100%   BMI 26.63 kg/m   Physical Exam  ED Course/Procedures     Procedures  MDM  patient is now been seen by psychiatry and cleared for discharge.  will follow-up with his previous resources.       Benjiman Core, MD 08/17/21 1410

## 2021-08-17 NOTE — BH Assessment (Signed)
Comprehensive Clinical Assessment (CCA) Note  08/17/2021 Zachary [name] [id number]  Disposition: Zachary [name], PMHNP recommends pt to be observed and reassessed by psychiatry. Disposition discussed with Zachary Soldersoni Walker, RN. RN to discuss disposition with EDP.  Flowsheet Row ED [facility]  C-SSRS RISK CATEGORY Error: Question 6 not populated Low Risk      The patient demonstrates the following risk factors for suicide: Chronic risk factors for suicide include: psychiatric disorder of  Major Depressive Disorder, recurrent episode, moderate (HCC), Mild intellectual disability (Per chart) . Acute risk factors for suicide include: N/A. Protective factors for this patient include:  NA . Considering these factors, the overall suicide risk at this point appears to be UTA. Patient is appropriate for outpatient follow up.  Zachary Flavinravis Mccance is a 20 year old male who presents voluntary and unaccompanied to [facility]. Clinician asked the pt, "what brought you to the hospital?" Per pt, he thought he overdosed on his night medications because while in the ED he was given his medications but got off schedule once discharged. Per pt, he called the ambulance to let them know what he did and was taken to the ED. Pt reports, he was not trying to hurt himself. Pt reports, he carried a knife on his side for protection. Pt denies, SI, HI, AVH, self-injurious behaviors and access to weapons.   Pt denies, substance use. Pt's UDS was negative. Pt reports, taking medication however he is unable to name his medications. Pt was [facility] at [facility].  Pt presents alert with normal speech. Pt's mood, affect was pleasant. Pt's insight was lacking. Pt's judgement was poor. Pt reports, he can contract for safety if discharged and he wants to go home.   Diagnosis: Major Depressive Disorder), recurrent episode, moderate (HCC).                    Mild intellectual disability (Per chart.)   *Pt consented for clinician to contact his grandmother Zachary [name](Zachary [name], [phone]) to obtain additional information. Clinician attempted to call pt's grandmother but call went straight to voicemail, clinician left HIPPA compliant voicemail with call back information. Per Sheralyn Boatmanoni, RN she attempted to call pt's grandmother but call went straight to voicemail as well.*  Chief Complaint:  Chief Complaint  Patient presents with   Ingestion   Visit Diagnosis:     CCA Screening, Triage and Referral (STR)  Patient Reported Information How did you hear about us? Other (Comment) ([facility]  What Is the Reason for Your Visit/Call Today? Patient present to [facility] after attempting suicide ident viaby carbon monoxide poisoning.  How Long Has This Been Causing You Problems? <Week  What Do You Feel Would Help You the Most Today? -- (Social work.)   Have You Recently Had Any Thoughts About Hurting Yourself? No (Pt denies.)  Are You Planning to Commit Suicide/Harm Yourself At This time? No (Pt denies.)   Have you Recently Had Thoughts About Hurting Someone Zachary [name]? No (Pt denies.)  Are You Planning to Harm Someone at This Time? No (Pt denies.)  Explanation: No data recorded  Have You Used Any Alcohol or Drugs in the Past 24 Hours? No (Pt denies.)  How Long Ago Did You Use Drugs or Alcohol? No data recorded What Did You Use and How Much? No data recorded  Do You Currently Have a Therapist/Psychiatrist? -- (Pt is not sure.)  Name of Therapist/Psychiatrist: No data  recorded  Have You Been Recently Discharged From Any Office Practice or Programs? No data recorded Explanation of Discharge From Practice/Program: No data recorded    CCA Screening Triage Referral Assessment Type of Contact: No data recorded Telemedicine Service Delivery:   Is this Initial or Reassessment? No data recorded Date Telepsych consult ordered in CHL:  No  data recorded Time Telepsych consult ordered in CHL:  No data recorded Location of Assessment: [facility]  Provider Location: [facility] Assessment Services   Collateral Involvement: Zachary [name], grandmother, [phone]   Does Patient Have a Court Appointed Legal Guardian? No data recorded Name and Contact of Legal Guardian: No data recorded If Minor and Not Living with Parent(s), Who has Custody? No data recorded Is CPS involved or ever been involved? No data recorded Is APS involved or ever been involved? No data recorded  Patient Determined To Be At Risk for Harm To Self or Others Based on Review of Patient Reported Information or Presenting Complaint? No  Method: No data recorded Availability of Means: No data recorded Intent: No data recorded Notification Required: No data recorded Additional Information for Danger to Others Potential: No data recorded Additional Comments for Danger to Others Potential: No data recorded Are There Guns or Other Weapons in Your Home? No data recorded Types of Guns/Weapons: No data recorded Are These Weapons Safely Secured?                            No data recorded Who Could Verify You Are Able To Have These Secured: No data recorded Do You Have any Outstanding Charges, Pending Court Dates, Parole/Probation? No data recorded Contacted To Inform of Risk of Harm To Self or Others: No data recorded   Does Patient Present under Involuntary Commitment? No data recorded IVC Papers Initial File Date: No data recorded  [place] of Residence: [place]   Patient Currently Receiving the Following Services: Medication Management   Determination of Need: Urgent (48 hours)   Options For Referral: Other: Comment (Pt to be observed and reassessed by psychiatry.)     CCA Biopsychosocial Patient Reported Schizophrenia/Schizoaffective Diagnosis in Past: No   Strengths: watching television   Mental Health Symptoms Depression:   None (Pt  denies.)   Duration of Depressive symptoms:    Mania:   N/A   Anxiety:    Worrying   Psychosis:   None (Pt denies.)   Duration of Psychotic symptoms:    Trauma:   None (Pt denies.)   Obsessions:   N/A   Compulsions:   N/A   Inattention:   Disorganized; Forgetful; Loses things   Hyperactivity/Impulsivity:   None (Pt denies.)   Oppositional/Defiant Behaviors:   Angry; Temper   Emotional Irregularity:  No data recorded  Other Mood/Personality Symptoms:  No data recorded   Mental Status Exam Appearance and self-care  Stature:   Average   Weight:   Average weight   Clothing:   Casual   Grooming:   Normal   Cosmetic use:   None   Posture/gait:   Normal   Motor activity:   Not Remarkable   Sensorium  Attention:   Normal   Concentration:   Normal   Orientation:   X5   Recall/memory:  No data recorded  Affect and Mood  Affect:   Other (Comment) (Pleasant.)   Mood:   Other (Comment) (Pleasant.)   Relating  Eye contact:   None   Facial expression:  Responsive   Attitude toward examiner:   Cooperative   Thought and Language  Speech flow:  Normal   Thought content:   Appropriate to Mood and Circumstances   Preoccupation:   None   Hallucinations:   None   Organization:  No data recorded  [employer] of Knowledge:  No data recorded  Intelligence:   Above Average   Abstraction:  No data recorded  Judgement:   Poor   Reality Testing:   Distorted   Insight:   Lacking   Decision Making:   Impulsive   Social Functioning  Social Maturity:   Impulsive   Social Judgement:  No data recorded  Stress  Stressors:   Other (Comment) (Per pt, "being shipped [facility]   Coping Ability:   Overwhelmed   Skill Deficits:   Communication; Decision making   Supports:   Family     Religion: Religion/Spirituality Are You A Religious Person?: Yes What is Your Religious Affiliation?:  Chiropodist: Leisure / Recreation Do You Have Hobbies?: Yes Leisure and Hobbies: Per pt "listening to music helps me fall asleep."  Exercise/Diet: Exercise/Diet Do You Exercise?: Yes What Type of Exercise Do You Do?: Weight Training Do You Follow a Special Diet?: No Do You Have Any Trouble Sleeping?: No   CCA Employment/Education Employment/Work Situation: Employment / Work Systems developer:  (Pt is not sure.) Has Patient ever Been in the [employer]?: No  Education: Education Is Patient Currently Attending School?: No Last Grade Completed:  (Pt is not sure.) Did Theme park manager?: No   CCA Family/Childhood History Family and Relationship History: Family history Marital status: Single Does patient have children?: No  Childhood History:  Childhood History By whom was/is the patient raised?: Grandparents (Per chart.) Did patient suffer any verbal/emotional/physical/sexual abuse as a child?: No Has patient ever been sexually abused/assaulted/raped as an adolescent or adult?: No Was the patient ever a victim of a crime or a disaster?: No Witnessed domestic violence?: No Has patient been affected by domestic violence as an adult?:  (NA)  Child/Adolescent Assessment:     CCA Substance Use Alcohol/Drug Use: Alcohol / Drug Use Pain Medications: See MAR Prescriptions: See MAR Over the Counter: See MAR History of alcohol / drug use?: No history of alcohol / drug abuse     ASAM's:  Six Dimensions of Multidimensional Assessment  Dimension 1:  Acute Intoxication and/or Withdrawal Potential:      Dimension 2:  Biomedical Conditions and Complications:      Dimension 3:  Emotional, Behavioral, or Cognitive Conditions and Complications:     Dimension 4:  Readiness to Change:     Dimension 5:  Relapse, Continued use, or Continued Problem Potential:     Dimension 6:  Recovery/Living Environment:     ASAM Severity Score:    ASAM  Recommended Level of Treatment:     Substance use Disorder (SUD)    Recommendations for Services/Supports/Treatments: Recommendations for Services/Supports/Treatments Recommendations For Services/Supports/Treatments: Other (Comment) (Pt to be observed and reassessed by psychiaty.)  Discharge Disposition:    DSM5 Diagnoses: Patient Active Problem List   Diagnosis Date Noted   PTSD (post-traumatic stress disorder) 08/11/2021   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, requiring support 04/13/2018   Intermittent explosive disorder 09/02/2016   Poor sleep hygiene 09/02/2016   Partial epilepsy with impairment of consciousness, not intractable (HCC) 09/02/2016   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   Psychiatric exam requested by authority  MDD (major depressive disorder), recurrent episode, moderate (HCC) 11/05/2014   Problems with learning 08/15/2014   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   ADHD (attention deficit hyperactivity disorder), combined type 08/26/2013   Long-term use of high-risk medication 08/26/2013     Referrals to Alternative Service(s): Referred to Alternative Service(s):   Place:   Date:   Time:    Referred to Alternative Service(s):   Place:   Date:   Time:    Referred to Alternative Service(s):   Place:   Date:   Time:    Referred to Alternative Service(s):   Place:   Date:   Time:     Redmond Pulling, [facility] Comprehensive Clinical Assessment (CCA) Screening, Triage and Referral Note  08/17/2021 Zachary [name] [id number]  Chief Complaint:  Chief Complaint  Patient presents with   Ingestion   Visit Diagnosis:   Patient Reported Information How did you hear about [place]? Other (Comment) ([facility]  What Is the Reason for Your Visit/Call Today? Patient present to [facility] after attempting suicide ident viaby carbon monoxide  poisoning.  How Long Has This Been Causing You Problems? <Week  What Do You Feel Would Help You the Most Today? -- (Social work.)   Have You Recently Had Any Thoughts About Hurting Yourself? No (Pt denies.)  Are You Planning to Commit Suicide/Harm Yourself At This time? No (Pt denies.)   Have you Recently Had Thoughts About Hurting Someone Zachary Ohs? No (Pt denies.)  Are You Planning to Harm Someone at This Time? No (Pt denies.)  Explanation: No data recorded  Have You Used Any Alcohol or Drugs in the Past 24 Hours? No (Pt denies.)  How Long Ago Did You Use Drugs or Alcohol? No data recorded What Did You Use and How Much? No data recorded  Do You Currently Have a Therapist/Psychiatrist? -- (Pt is not sure.)  Name of Therapist/Psychiatrist: No data recorded  Have You Been Recently Discharged From Any Office Practice or Programs? No data recorded Explanation of Discharge From Practice/Program: No data recorded   CCA Screening Triage Referral Assessment Type of Contact: No data recorded Telemedicine Service Delivery:   Is this Initial or Reassessment? No data recorded Date Telepsych consult ordered in CHL:  No data recorded Time Telepsych consult ordered in CHL:  No data recorded Location of Assessment: [facility]  Provider Location: [facility] Assessment Services   Collateral Involvement: Zachary [name], grandmother, [phone]   Does Patient Have a Court Appointed Legal Guardian? No data recorded Name and Contact of Legal Guardian: No data recorded If Minor and Not Living with Parent(s), Who has Custody? No data recorded Is CPS involved or ever been involved? No data recorded Is APS involved or ever been involved? No data recorded  Patient Determined To Be At Risk for Harm To Self or Others Based on Review of Patient Reported Information or Presenting Complaint? No  Method: No data recorded Availability of Means: No data recorded Intent: No data recorded Notification  Required: No data recorded Additional Information for Danger to Others Potential: No data recorded Additional Comments for Danger to Others Potential: No data recorded Are There Guns or Other Weapons in Your Home? No data recorded Types of Guns/Weapons: No data recorded Are These Weapons Safely Secured?                            No data recorded Who Could Verify You  Are Able To Have These Secured: No data recorded Do You Have any Outstanding Charges, Pending Court Dates, Parole/Probation? No data recorded Contacted To Inform of Risk of Harm To Self or Others: No data recorded  Does Patient Present under Involuntary Commitment? No data recorded IVC Papers Initial File Date: No data recorded  [place] of Residence: [place]   Patient Currently Receiving the Following Services: Medication Management   Determination of Need: Urgent (48 hours)   Options For Referral: Other: Comment (Pt to be observed and reassessed by psychiatry.)   Discharge Disposition:     Redmond Pulling, [facility]     Redmond Pulling, MS, [facility] Triage Specialist [phone]

## 2021-08-17 NOTE — Progress Notes (Signed)
TOC consulted for possible placement. CSW spoke with Attending and RN, pt is from home with his grandmother. Pts RN has spoken with pts grandmother and she will not be able to get pt until after 4. CSW updated ED staff that consult will be cleared and if there are any issues with pts discharge let TOC know and we will assist as we can. TOC signing off.

## 2021-08-17 NOTE — ED Notes (Signed)
Tele-Camera set up for pt safety. Pt calm at this time.

## 2021-08-17 NOTE — ED Notes (Signed)
Pt provided discharge instructions and prescription information. Pt was given the opportunity to ask questions and questions were answered. Discharge signature not obtained in the setting of the COVID-19 pandemic in order to reduce high touch surfaces.   Pt d/c into care of grandmother.

## 2021-08-17 NOTE — Discharge Instructions (Signed)
Patient has been advised to contact [phone] in the event he is experiencing suicidal ideation. By calling or texting [phone], the patient will have access to 24 hour confidential support when experiencing a crisis or any emotional distress.

## 2021-08-17 NOTE — Progress Notes (Signed)
CSW added suicidal prevention and crisis support information to the patient's AVS. Patient has been psych cleared by providers.   Damita Dunnings, MSW, LCSW-A  1:45 PM 08/17/2021

## 2021-08-17 NOTE — ED Triage Notes (Signed)
Pt is unsure if he took home meds tonight and if he did he may have taken too much. Pt is unsure. Pt denies any si/hi ideations.

## 2021-08-17 NOTE — ED Provider Notes (Signed)
[facility] Emergency Department Provider Note MRN:  [id number]  Arrival date & time: 08/17/21     Chief Complaint   Ingestion   History of Present Illness   Zachary [name] is a 20 y.o. year-old male with a history of autism, seizures presenting to the ED with chief complaint of ingestion.  Patient is concerned that he might have overdosed on his home medications.  He does not remember how much he took.  He does not remember taking his medications.  Does not know if he took the normal amount or if he took too much or if he took too little.  He is endorsing back and shoulder pain for several weeks, unchanged this evening.  Otherwise he has no complaints, no confusion or excessive sleepiness, no nausea vomiting, no abdominal pain, no chest pain or shortness of breath.  Review of Systems  A complete 10 system review of systems was obtained and all systems are negative except as noted in the HPI and PMH.   Patient's Health History    Past Medical History:  Diagnosis Date   ADHD (attention deficit hyperactivity disorder)    Autism    MDD (major depressive disorder)    Post traumatic stress disorder (PTSD)    Seasonal allergies    Seizures (HCC)    Social anxiety disorder of childhood     Past Surgical History:  Procedure Laterality Date   CIRCUMCISION  2002    Family History  Problem Relation Age of Onset   Other Mother        Slow Learner   Seizures Mother        Had Seizures Due to Head Trauma   Other Father        Slow Learner   Asthma Sister    Seizures Maternal Grandmother        Generalized Seizures   Heart attack Maternal Grandfather        Died at 32    Social History   Socioeconomic History   Marital status: Single    Spouse name: Not on file   Number of children: Not on file   Years of education: Not on file   Highest education level: Not on file  Occupational History   Not on file  Tobacco Use   Smoking status: Never    Smokeless tobacco: Never  Vaping Use   Vaping Use: Every day  Substance and Sexual Activity   Alcohol use: No   Drug use: No   Sexual activity: Never  Other Topics Concern   Not on file  Social History Narrative   Zachary [name] is no longer in school.   He lives with his mother.   He enjoys working with his hands, mowing, weed eating, drawing, and playing on his phone.   Social Determinants of Health   Financial Resource Strain: Not on file  Food Insecurity: Not on file  Transportation Needs: Not on file  Physical Activity: Not on file  Stress: Not on file  Social Connections: Not on file  Intimate Partner Violence: Not on file     Physical Exam   Vitals:   08/17/21 0030 08/17/21 0100  BP: 120/86 (!) 129/96  Pulse: 86 (!) 102  Resp:  14  Temp:    SpO2: 100% 100%    CONSTITUTIONAL: Well-appearing, NAD NEURO:  Alert and oriented x 3, no focal deficits EYES:  eyes equal and reactive ENT/NECK:  no LAD, no JVD CARDIO: Regular rate, well-perfused, normal S1  and S2 PULM:  CTAB no wheezing or rhonchi GI/GU:  normal bowel sounds, non-distended, non-tender MSK/SPINE:  No gross deformities, no edema SKIN:  no rash, atraumatic PSYCH:  Appropriate speech and behavior  *Additional and/or pertinent findings included in MDM below  Diagnostic and Interventional Summary    EKG Interpretation  Date/Time:  Tuesday August 17 2021 00:45:51 EDT Ventricular Rate:  92 PR Interval:  135 QRS Duration: 91 QT Interval:  348 QTC Calculation: 431 R Axis:   78 Text Interpretation: Sinus rhythm Baseline wander in lead(s) III Confirmed by Kennis Carina [phone]) on 08/17/2021 12:48:46 AM       Labs Reviewed - No data to display  No orders to display    Medications - No data to display   Procedures  /  Critical Care Procedures  ED Course and Medical Decision Making  I have reviewed the triage vital signs, the nursing notes, and pertinent available records from the EMR.  Listed above  are laboratory and imaging tests that I personally ordered, reviewed, and interpreted and then considered in my medical decision making (see below for details).  Currently no evidence to suggest a toxidrome of any kind, there is suspicion patient is here mostly to get away from his home situation.  Was just here in the emergency department and evaluated by psychiatry, had turned on a leaf blower in the garage in an enclosed space.  He was cleared by psych.  Seems that grandmother does not want to send him to inpatient psych.  Overall a complex situation, but a low concern for emergent process at this time.  We will observe patient here in the emergency department for a few hours and monitor closely for any signs of toxidrome or overdose.  Will escalate care and testing if needed.     Grandmother is here and confirms that all pills are accounted for, no overdose.  She is upset that patient called an ambulance while she was sleeping in the next room.  She has changed her mind about placement, she is now requesting that he be reevaluated for placement, possibly to [facility] given that this was the recommendation from TTS few days ago, will reconsult.  Signed out to default provider.  Medically cleared.  Elmer Sow. Pilar Plate, MD [facility] Emergency Medicine [facility] [email or web address]  Final Clinical Impressions(s) / ED Diagnoses     ICD-10-CM   1. Encounter for psychological evaluation  Z00.8       ED Discharge Orders     None        Discharge Instructions Discussed with and Provided to Patient:   Discharge Instructions   None       Sabas Sous, MD 08/17/21 0710

## 2021-10-03 ENCOUNTER — Other Ambulatory Visit (INDEPENDENT_AMBULATORY_CARE_PROVIDER_SITE_OTHER): Payer: Self-pay | Admitting: Pediatrics

## 2021-10-03 DIAGNOSIS — G40309 Generalized idiopathic epilepsy and epileptic syndromes, not intractable, without status epilepticus: Secondary | ICD-10-CM

## 2021-10-05 ENCOUNTER — Other Ambulatory Visit: Payer: Self-pay | Admitting: Neurology

## 2021-10-05 ENCOUNTER — Ambulatory Visit: Payer: Medicaid Other | Admitting: Neurology

## 2021-10-05 ENCOUNTER — Encounter: Payer: Self-pay | Admitting: Neurology

## 2021-10-05 VITALS — BP 126/89 | HR 101 | Ht 71.0 in | Wt 190.0 lb

## 2021-10-05 DIAGNOSIS — Z5181 Encounter for therapeutic drug level monitoring: Secondary | ICD-10-CM

## 2021-10-05 DIAGNOSIS — G40309 Generalized idiopathic epilepsy and epileptic syndromes, not intractable, without status epilepticus: Secondary | ICD-10-CM | POA: Diagnosis not present

## 2021-10-05 DIAGNOSIS — F70 Mild intellectual disabilities: Secondary | ICD-10-CM | POA: Diagnosis not present

## 2021-10-05 DIAGNOSIS — G40209 Localization-related (focal) (partial) symptomatic epilepsy and epileptic syndromes with complex partial seizures, not intractable, without status epilepticus: Secondary | ICD-10-CM

## 2021-10-05 MED ORDER — CLOBAZAM 10 MG PO TABS: 10.0000 mg | ORAL_TABLET | Freq: Every evening | ORAL | 4 refills | Status: DC

## 2021-10-05 MED ORDER — LAMOTRIGINE 100 MG PO TABS: ORAL_TABLET | ORAL | 4 refills | Status: DC

## 2021-10-05 MED ORDER — NAYZILAM 5 MG/0.1ML NA SOLN: 5.0000 mg | Freq: Once | NASAL | 2 refills | Status: AC

## 2021-10-05 NOTE — Patient Instructions (Signed)
Continue with current medications   Keppra 1000 mg twice a day   Lamictal 400 mg in the morning and 500 mg at night  Start Clobazam 1/2 tab for one week then increase to full tab thereafter Nayzilam to use for seizure lasting more than 5 minutes.   Discontinue the Klonopin  Will check labs today  Return in 6 months

## 2021-10-05 NOTE — Progress Notes (Signed)
[facility]  PATIENT: Zachary [name] DOB: Oct 03, 2001  REFERRING CLINICIAN: Deetta Perla, MD HISTORY FROM: Patient and Grandmother  REASON FOR VISIT: Seizures    HISTORICAL  CHIEF COMPLAINT:  Chief Complaint  Patient presents with   Seizures    RM 12, with grandmother. Seizures. Last seizure was 2 months ago.    HISTORY OF PRESENT ILLNESS:  This is a 20 year old gentleman with past medical history of generalized seizure, autism spectrum disorder and depression who is presenting for management of his epilepsy.  Patient was previously under the care of Dr. Sharene Skeans for his seizure disorder.  Per chart review he has his first seizure in 2012 described as generalized tonic-clonic, at that time he had a EEG that showed high amplitude generalized spikes, polyspike or slow wave discharges that occur singly at 1 to 2 seconds of cluster.  He did have a second seizure in March 2013 while traveling in car with his father there was flickering of some light to the trees.  He began to blink, stare and then has a generalized tonic-clonic seizure.  At that time he had a head CT which was normal.  He was initially started on Depakote but did not tolerate due to agitation. Currently he is on Keppra 1000 mg twice daily and lamotrigine 400 mg in the morning and 500 mg at night.  He is also on Klonopin 1.25 mg at bedtime.  Grandmother reports that the last seizure occurred 63-month ago while he was attending a car race.  He reported there was light on the track but the cars themselves did not have any headlights.  Prior to that his previous seizure was a couple weeks before.  They do not have a rescue medication.   Handedness: Right handed   Seizure Type: Focal seizures and generalized seizures  Current frequency: Last seizure 2 months   Any injuries from seizures: None   Seizure risk factors: Autism,   Previous ASMs: Depakote   Currenty ASMs: Lamotrigine 400 mg AM and 500 mg  PM, Levetiracetam 1000 mg BID and Klonopin 1.25 mg at bedtime   ASMs side effects: None   Brain Images: normal Heat CT   Previous EEGs: High amplitude generalized spike polyspike and slow wave   OTHER MEDICAL CONDITIONS: Generalized epilepsy, ADHD, autism, depression  REVIEW OF SYSTEMS: Full 14 system review of systems performed and negative with exception of: As noted in the HPI  ALLERGIES: Allergies  Allergen Reactions   Strawberry Extract Rash    HOME MEDICATIONS: Outpatient Medications Prior to Visit  Medication Sig Dispense Refill   acetaminophen (TYLENOL) 500 MG tablet Take 500 mg by mouth every 6 (six) hours as needed for mild pain.     amantadine (SYMMETREL) 100 MG capsule TAKE 1 CAPSULE BY MOUTH TWICE A DAY 62 capsule 5   levETIRAcetam (KEPPRA) 500 MG tablet TAKE 2 TABLETS BY MOUTH TWICE A DAY 124 tablet 5   Melatonin 10 MG CAPS Take 1 capsule by mouth at bedtime.     clonazePAM (KLONOPIN) 0.5 MG tablet TAKE 2 AND 1/2 TABLETS BY MOUTH AT BEDTIME 80 tablet 4   lamoTRIgine (LAMICTAL) 100 MG tablet TAKE 4 TABLETS IN THE MORNING AND 5 TABLETS AT NIGHTTIME 280 tablet 0   No facility-administered medications prior to visit.    PAST MEDICAL HISTORY: Past Medical History:  Diagnosis Date   ADHD (attention deficit hyperactivity disorder)    Autism    MDD (major depressive disorder)    Post traumatic  stress disorder (PTSD)    Seasonal allergies    Seizures (HCC)    Social anxiety disorder of childhood     PAST SURGICAL HISTORY: Past Surgical History:  Procedure Laterality Date   CIRCUMCISION  2002    FAMILY HISTORY: Family History  Problem Relation Age of Onset   Other Mother        Slow Learner   Seizures Mother        Had Seizures Due to Head Trauma   Other Father        Slow Learner   Asthma Sister    Seizures Maternal Grandmother        Generalized Seizures   Heart attack Maternal Grandfather        Died at 58    SOCIAL HISTORY: Social History    Socioeconomic History   Marital status: Single    Spouse name: Not on file   Number of children: Not on file   Years of education: Not on file   Highest education level: Not on file  Occupational History   Not on file  Tobacco Use   Smoking status: Never   Smokeless tobacco: Never  Vaping Use   Vaping Use: Every day  Substance and Sexual Activity   Alcohol use: No   Drug use: No   Sexual activity: Never  Other Topics Concern   Not on file  Social History Narrative   Zachary [name] is no longer in school.   He lives with his mother.   He enjoys working with his hands, mowing, weed eating, drawing, and playing on his phone.   Social Determinants of Health   Financial Resource Strain: Not on file  Food Insecurity: Not on file  Transportation Needs: Not on file  Physical Activity: Not on file  Stress: Not on file  Social Connections: Not on file  Intimate Partner Violence: Not on file     PHYSICAL EXAM  GENERAL EXAM/CONSTITUTIONAL: Vitals:  Vitals:   10/05/21 1434  BP: 126/89  Pulse: (!) 101  Weight: 190 lb (86.2 kg)  Height: 5\' 11"  (1.803 m)   Body mass index is 26.5 kg/m. Wt Readings from Last 3 Encounters:  10/05/21 190 lb (86.2 kg)  08/17/21 182 lb 15.7 oz (83 kg)  08/10/21 182 lb 15.7 oz (83 kg)   Patient is in no distress; well developed, nourished and groomed; neck is supple  EYES: Pupils round and reactive to light, Visual fields full to confrontation, Extraocular movements intacts,  No results found.  MUSCULOSKELETAL: Gait, strength, tone, movements noted in Neurologic exam below  NEUROLOGIC: MENTAL STATUS:  No flowsheet data found. awake, alert, oriented to person, place and time recent and remote memory intact normal attention and concentration language fluent, comprehension intact, naming intact   CRANIAL NERVE:  2nd, 3rd, 4th, 6th - pupils equal and reactive to light, visual fields full to confrontation, extraocular muscles intact, no  nystagmus 5th - facial sensation symmetric 7th - facial strength symmetric 8th - hearing intact 9th - palate elevates symmetrically, uvula midline 11th - shoulder shrug symmetric 12th - tongue protrusion midline  MOTOR:  normal bulk and tone, full strength in the BUE, BLE  SENSORY:  normal and symmetric to light touch, pinprick, temperature, vibration  COORDINATION:  finger-nose-finger, fine finger movements normal  REFLEXES:  deep tendon reflexes present and symmetric  GAIT/STATION:  normal   DIAGNOSTIC DATA (LABS, IMAGING, TESTING) - I reviewed patient records, labs, notes, testing and imaging myself where available.  Lab  Results  Component Value Date   WBC 6.8 08/10/2021   HGB 16.3 08/10/2021   HCT 46.8 08/10/2021   MCV 90.2 08/10/2021   PLT 293 08/10/2021      Component Value Date/Time   NA 137 08/10/2021 0133   K 3.6 08/10/2021 0133   CL 105 08/10/2021 0133   CO2 26 08/10/2021 0133   GLUCOSE 110 (H) 08/10/2021 0133   BUN 9 08/10/2021 0133   CREATININE 0.99 08/10/2021 0133   CALCIUM 9.3 08/10/2021 0133   PROT 8.1 08/10/2021 0133   ALBUMIN 4.7 08/10/2021 0133   AST 24 08/10/2021 0133   ALT 31 08/10/2021 0133   ALKPHOS 98 08/10/2021 0133   BILITOT 0.9 08/10/2021 0133   GFRNONAA >60 08/10/2021 0133   GFRAA >60 06/22/2019 1431   No results found for: CHOL, HDL, LDLCALC, LDLDIRECT, TRIG No results found for: HGBA1C No results found for: VITAMINB12 Lab Results  Component Value Date   TSH 3.194 12/29/2010     Head CT 2012 Normal head CT   EEG 2015 This is a abnormal record with the patient awake.  Is characterized by generalized spike and slow-wave activity and bilateral occipital spike and slow-wave discharges that are epileptogenic from an electrographic viewpoint.  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  I personally reviewed brain Images and previous EEG reports.   ASSESSMENT AND PLAN  20 y.o. year  old male  with past medical history of generalized epilepsy, mild intellectual disability, autism spectrum disorder and depression who is presenting for management of his epilepsy.  Patient is currently on lamotrigine 400 mg in the morning and 500 at night, levetiracetam 1000 mg twice daily and Klonopin 1.25 mg at bedtime but his seizures are not well controlled.  There are times that he goes without any seizure but he still having breakthrough seizures.  I will continue the lamotrigine and levetiracetam but will discontinue the Klonopin and add clobazam 10 mg nightly (long-acting benzodiazepine).  I will also check the level of the lamotrigine the levetiracetam and prescribe rescue medication Nayzilam. Follow up in 6 months    1. Generalized convulsive epilepsy (HCC)   2. Partial epilepsy with impairment of consciousness, not intractable (HCC)   3. Mild intellectual disability      PLAN: Continue with current medications   Keppra 1000 mg twice a day   Lamictal 400 mg in the morning and 500 mg at night  Start Clobazam 1/2 tab for one week then increase to full tab thereafter Nayzilam to use for seizure lasting more than 5 minutes.   Discontinue the Klonopin  Will check labs today  Return in 6 months     Per [facility] statutes, patients with seizures are not allowed to drive until they have been seizure-free for six months.  Other recommendations include using caution when using heavy equipment or power tools. Avoid working on ladders or at heights. Take showers instead of baths.  Do not swim alone.  Ensure the water temperature is not too high on the home water heater. Do not go swimming alone. Do not lock yourself in a room alone (i.e. bathroom). When caring for infants or small children, sit down when holding, feeding, or changing them to minimize risk of injury to the child in the event you have a seizure. Maintain good sleep hygiene. Avoid alcohol.  Also recommend adequate sleep,  hydration, good diet and minimize stress.   During the Seizure  - First,  ensure adequate ventilation and place patients on the floor on their left side  Loosen clothing around the neck and ensure the airway is patent. If the patient is clenching the teeth, do not force the mouth open with any object as this can cause severe damage - Remove all items from the surrounding that can be hazardous. The patient may be oblivious to what's happening and may not even know what he or she is doing. If the patient is confused and wandering, either gently guide him/her away and block access to outside areas - Reassure the individual and be comforting - Call 911. In most cases, the seizure ends before EMS arrives. However, there are cases when seizures may last over 3 to 5 minutes. Or the individual may have developed breathing difficulties or severe injuries. If a pregnant patient or a person with diabetes develops a seizure, it is prudent to call an ambulance. - Finally, if the patient does not regain full consciousness, then call EMS. Most patients will remain confused for about 45 to 90 minutes after a seizure, so you must use judgment in calling for help. - Avoid restraints but make sure the patient is in a bed with padded side rails - Place the individual in a lateral position with the neck slightly flexed; this will help the saliva drain from the mouth and prevent the tongue from falling backward - Remove all nearby furniture and other hazards from the area - Provide verbal assurance as the individual is regaining consciousness - Provide the patient with privacy if possible - Call for help and start treatment as ordered by the caregiver   After the Seizure (Postictal Stage)  After a seizure, most patients experience confusion, fatigue, muscle pain and/or a headache. Thus, one should permit the individual to sleep. For the next few days, reassurance is essential. Being calm and helping reorient the  person is also of importance.  Most seizures are painless and end spontaneously. Seizures are not harmful to others but can lead to complications such as stress on the lungs, brain and the heart. Individuals with prior lung problems may develop labored breathing and respiratory distress.     Orders Placed This Encounter  Procedures   Lamotrigine level   Levetiracetam level   Basic Metabolic Panel   VITAMIN D 25 Hydroxy (Vit-D Deficiency, Fractures)    Meds ordered this encounter  Medications   cloBAZam (ONFI) 10 MG tablet    Sig: Take 1 tablet (10 mg total) by mouth at bedtime.    Dispense:  90 tablet    Refill:  4   lamoTRIgine (LAMICTAL) 100 MG tablet    Sig: For seizure    Dispense:  280 tablet    Refill:  4    Changing providers - no further refills from this office after this one   Midazolam (NAYZILAM) 5 MG/0.1ML SOLN    Sig: Place 5 mg into the nose once for 1 dose.    Dispense:  2 each    Refill:  2    Return in about 6 months (around 04/05/2022).    Windell Norfolk, MD 10/05/2021, 10:25 PM  [facility] [address] [place], [place] [address] [phone]

## 2021-10-08 ENCOUNTER — Other Ambulatory Visit: Payer: Self-pay | Admitting: Neurology

## 2021-10-08 DIAGNOSIS — G40309 Generalized idiopathic epilepsy and epileptic syndromes, not intractable, without status epilepticus: Secondary | ICD-10-CM

## 2021-10-08 LAB — BASIC METABOLIC PANEL
BUN/Creatinine Ratio: 4 — ABNORMAL LOW (ref 9–20)
BUN: 5 mg/dL — ABNORMAL LOW (ref 6–20)
CO2: 26 mmol/L (ref 20–29)
Calcium: 10.2 mg/dL (ref 8.7–10.2)
Chloride: 101 mmol/L (ref 96–106)
Creatinine, Ser: 1.18 mg/dL (ref 0.76–1.27)
Glucose: 95 mg/dL (ref 70–99)
Potassium: 4.1 mmol/L (ref 3.5–5.2)
Sodium: 141 mmol/L (ref 134–144)
eGFR: 91 mL/min/{1.73_m2} (ref >59)

## 2021-10-08 LAB — VITAMIN D 25 HYDROXY (VIT D DEFICIENCY, FRACTURES): Vit D, 25-Hydroxy: 29.9 ng/mL — ABNORMAL LOW (ref 30.0–100.0)

## 2021-10-08 LAB — LEVETIRACETAM LEVEL: Levetiracetam Lvl: 21.9 ug/mL (ref 10.0–40.0)

## 2021-10-08 LAB — LAMOTRIGINE LEVEL: Lamotrigine Lvl: 12.5 ug/mL (ref 2.0–20.0)

## 2021-10-08 MED ORDER — VITAMIN D (ERGOCALCIFEROL) 1.25 MG (50000 UNIT) PO CAPS: 50000.0000 [IU] | ORAL_CAPSULE | ORAL | 0 refills | Status: DC

## 2021-10-08 NOTE — Telephone Encounter (Signed)
Spoke with grandmother, informed her that Zachary [name] and Zachary [name] were within normal limits.  His vitamin D [name] was low I will prescribe him a vitamin D supplement to take weekly for 8 weeks and he will need to follow-up with his primary care doctor.  Grandmother agreeable with plan.  Follow-up as scheduled.  Windell Norfolk, MD

## 2021-10-11 NOTE — Telephone Encounter (Signed)
Meds to be filled at [facility]. [facility] is not in the patient profile

## 2021-10-12 MED ORDER — LAMOTRIGINE 100 MG PO TABS: ORAL_TABLET | ORAL | 5 refills | Status: DC

## 2021-10-12 NOTE — Telephone Encounter (Signed)
Rx was sent without instructions. I added instructions (4 in the am, 5 in the pm) and resent Rx to pharmacy.

## 2021-10-12 NOTE — Addendum Note (Signed)
Addended by: Rocky Link A on: 10/12/2021 01:56 PM   Modules accepted: Orders

## 2021-10-18 ENCOUNTER — Other Ambulatory Visit: Payer: Self-pay | Admitting: Neurology

## 2021-10-18 ENCOUNTER — Encounter: Payer: Self-pay | Admitting: Neurology

## 2021-10-18 ENCOUNTER — Telehealth: Payer: Self-pay | Admitting: Neurology

## 2021-10-18 DIAGNOSIS — G40309 Generalized idiopathic epilepsy and epileptic syndromes, not intractable, without status epilepticus: Secondary | ICD-10-CM

## 2021-10-18 NOTE — Telephone Encounter (Signed)
Spoke with grandmother, she believes seizure is related of overexertion. I will obtain a 24 hrs amb EEG.   Windell Norfolk, MD

## 2021-10-18 NOTE — Telephone Encounter (Signed)
I returned the call to the patient's grandmother on [place]. Reports having a prolonged seizure this past Saturday, lasting about 5 minutes. Full body jerking/tremors, biting tongue. EMS was called and he declined to go to the ED. Returned to baseline about 15 minutes after the event. He hit his body on a picnic table while falling to the ground and his head on the concrete. Several cuts and bruises, nothing too serious per grandmother. No missed doses of his anticonvulsant therapy. No new supplements, OTC or prescribed meds started recently. Denies any type of infection. That morning he had been working in an extremely hot attic and was physically worn out from the heat. Then went to church to fry fish around a hot cooker. May be related to overexertion. She would like a call back after Dr. Teresa Coombs has reviewed.

## 2021-10-18 NOTE — Telephone Encounter (Signed)
Left message for his grandmother on DPR to call us back.

## 2021-10-18 NOTE — Telephone Encounter (Signed)
Pt's grandmother, [name] (on [place]) called, on 11/5 had a seizure , lasted 5 mins, was 15 mins before he came around. Hit head on the cement, knot on forehead, bruise under left eye, complaining left side.  Pt refuse to go to the ER. Would like a call from nurse

## 2021-11-05 ENCOUNTER — Other Ambulatory Visit: Payer: Self-pay | Admitting: Neurology

## 2021-11-22 ENCOUNTER — Telehealth: Payer: Self-pay | Admitting: Neurology

## 2021-11-22 DIAGNOSIS — G40309 Generalized idiopathic epilepsy and epileptic syndromes, not intractable, without status epilepticus: Secondary | ICD-10-CM

## 2021-11-22 DIAGNOSIS — G40209 Localization-related (focal) (partial) symptomatic epilepsy and epileptic syndromes with complex partial seizures, not intractable, without status epilepticus: Secondary | ICD-10-CM

## 2021-11-22 MED ORDER — LEVETIRACETAM 500 MG PO TABS
ORAL_TABLET | ORAL | 1 refills | Status: DC
Start: 2021-11-22 — End: 2022-04-07

## 2021-11-22 NOTE — Telephone Encounter (Signed)
Pt's grandmother, [name] (on [place]) requesting refill for levETIRAcetam (KEPPRA) 500 MG tablet at [phone]. Medication was prescribe by Dr. Sharene Skeans, but he has retired. He is out of medication. Would like a call from the nurse.

## 2021-11-22 NOTE — Telephone Encounter (Signed)
The patient is now under Dr. Karie Georges care.  Last note from 10/05/21:  PLAN: Continue with current medications              Keppra 1000 mg twice a day              Lamictal 400 mg in the morning and 500 mg at night  Start Clobazam 1/2 tab for one week then increase to full tab thereafter Nayzilam to use for seizure lasting more than 5 minutes.   Discontinue the Klonopin  Will check labs today  Return in 6 months   ____________________________________ Pending appt 04/05/22. I returned the call to his grandmother and left message that the refills have been sent to the pharmacy.

## 2022-04-05 ENCOUNTER — Ambulatory Visit: Payer: Medicaid Other | Admitting: Neurology

## 2022-04-07 ENCOUNTER — Other Ambulatory Visit: Payer: Self-pay | Admitting: Neurology

## 2022-04-07 DIAGNOSIS — G40209 Localization-related (focal) (partial) symptomatic epilepsy and epileptic syndromes with complex partial seizures, not intractable, without status epilepticus: Secondary | ICD-10-CM

## 2022-04-07 DIAGNOSIS — G40309 Generalized idiopathic epilepsy and epileptic syndromes, not intractable, without status epilepticus: Secondary | ICD-10-CM

## 2022-04-07 NOTE — Telephone Encounter (Signed)
Rx refilled as per last office visit note. 

## 2022-04-08 ENCOUNTER — Other Ambulatory Visit: Payer: Self-pay | Admitting: Neurology

## 2022-04-11 ENCOUNTER — Telehealth: Payer: Self-pay | Admitting: Neurology

## 2022-04-11 NOTE — Telephone Encounter (Signed)
Verify Drug Registry For Clobazam 10 Mg Tablet ?Last Filled: 03/03/2022 ?Quantity: 30 tablets for 30 days ?Last appointment: 10/05/2021 ?Next appointment: 04/19/2022 ?

## 2022-04-19 ENCOUNTER — Ambulatory Visit: Payer: Medicaid Other | Admitting: Neurology

## 2022-04-27 ENCOUNTER — Other Ambulatory Visit: Payer: Self-pay | Admitting: Neurology

## 2022-04-27 ENCOUNTER — Telehealth: Payer: Self-pay | Admitting: Neurology

## 2022-04-27 DIAGNOSIS — F6381 Intermittent explosive disorder: Secondary | ICD-10-CM

## 2022-04-27 MED ORDER — AMANTADINE HCL 100 MG PO CAPS: ORAL_CAPSULE | ORAL | 0 refills | Status: DC

## 2022-04-27 NOTE — Telephone Encounter (Signed)
Pt's grandmother on [place] called wanting to know if she can get the pt's amantadine (SYMMETREL) 100 MG capsule refilled. She states Dr. Emmaline Kluver office has closed and since he was referred and seen here she would like to know if a refill request for his amantadine (SYMMETREL) 100 MG capsule can be sent to [facility]  ?Please call Grandmother do advise.  ?

## 2022-04-27 NOTE — Telephone Encounter (Signed)
I have spoken to his grandmother. She will contact his PCP for future management of this medication.  ?

## 2022-04-27 NOTE — Telephone Encounter (Signed)
Please inform grand mother that I will give him a one time prescription but she will need to set up care with PCP or psychiatry in the future.  ? ?Thank

## 2022-05-22 ENCOUNTER — Other Ambulatory Visit: Payer: Self-pay | Admitting: Neurology

## 2022-05-22 ENCOUNTER — Other Ambulatory Visit (INDEPENDENT_AMBULATORY_CARE_PROVIDER_SITE_OTHER): Payer: Self-pay | Admitting: Family

## 2022-05-22 DIAGNOSIS — F6381 Intermittent explosive disorder: Secondary | ICD-10-CM

## 2022-05-22 DIAGNOSIS — G40309 Generalized idiopathic epilepsy and epileptic syndromes, not intractable, without status epilepticus: Secondary | ICD-10-CM

## 2022-05-23 ENCOUNTER — Encounter: Payer: Self-pay | Admitting: Neurology

## 2022-05-23 ENCOUNTER — Ambulatory Visit: Payer: Medicaid Other | Admitting: Neurology

## 2022-05-23 DIAGNOSIS — G40209 Localization-related (focal) (partial) symptomatic epilepsy and epileptic syndromes with complex partial seizures, not intractable, without status epilepticus: Secondary | ICD-10-CM

## 2022-05-23 DIAGNOSIS — G40309 Generalized idiopathic epilepsy and epileptic syndromes, not intractable, without status epilepticus: Secondary | ICD-10-CM | POA: Diagnosis not present

## 2022-05-23 DIAGNOSIS — F6381 Intermittent explosive disorder: Secondary | ICD-10-CM | POA: Diagnosis not present

## 2022-05-23 MED ORDER — CLOBAZAM 10 MG PO TABS: 10.0000 mg | ORAL_TABLET | Freq: Every evening | ORAL | 5 refills | Status: DC

## 2022-05-23 MED ORDER — CENOBAMATE 14 X 12.5 MG & 14 X 25 MG PO TBPK: ORAL_TABLET | ORAL | 0 refills | Status: AC

## 2022-05-23 MED ORDER — LAMOTRIGINE 100 MG PO TABS: ORAL_TABLET | ORAL | 5 refills | Status: DC

## 2022-05-23 MED ORDER — LEVETIRACETAM 500 MG PO TABS: 1000.0000 mg | ORAL_TABLET | Freq: Two times a day (BID) | ORAL | 1 refills | Status: DC

## 2022-05-23 MED ORDER — AMANTADINE HCL 100 MG PO CAPS: ORAL_CAPSULE | ORAL | 0 refills | Status: DC

## 2022-05-23 MED ORDER — CENOBAMATE 14 X 50 MG & 14 X100 MG PO TBPK: 50.0000 mg | ORAL_TABLET | Freq: Every day | ORAL | 0 refills | Status: DC

## 2022-05-23 NOTE — Patient Instructions (Signed)
Continue with current medications   Keppra 1000 mg twice a day   Lamictal 400 mg in the morning and 500 mg at night   Clobazam 10 mg nightly  Will start Cenobamate 12.5 mg daily for 2 weeks with plan to increase every 2 weeks up to 100mg  daily. I will see him in 8 weeks for lab works.  Nayzilam to use for seizure lasting more than 5 minutes.

## 2022-05-23 NOTE — Progress Notes (Signed)
[facility]  PATIENT: Zachary [name] DOB: 09/05/2001  REFERRING CLINICIAN: Corrington, Kip A, MD HISTORY FROM: Patient and Grandmother  REASON FOR VISIT: Seizures    HISTORICAL  CHIEF COMPLAINT:  Chief Complaint  Patient presents with   Follow-up    Rm 14. Accompanied by grandmother. Grandmother states pt had two back to back seizures last night, more intense than previous seizures. Grandmother states first seizure lasted approx 3 minutes, after a 3 minute break he had another seizure that lasted approx 3 minutes. Grandmother states that pt turned blue. Denies any missed doses of medication. Pt's grandmother states he has been taking Kratom.    INTERVAL HISTORY 05/23/2022:  Zachary [name] presents today for follow-up.  He is accompanied by his grandmother.  Last visit was in October, at that time I started him on clobazam.  Grandmother reports since starting the clobazam he has been doing well actually, was seizure-free up for about 3 months after that.  But seizure return at 2 months ago during this time also Zachary [name] was noted to start using kratom. Grandmother was not aware of it until later when Zachary [name] told him about the Kratom.  He reported that yesterday he had 2 strong generalized convulsions back-to-back each lasting about 3 minutes.  She did not take him to the emergency department.  Zachary [name] reported he has not taken the kratom for about 2 weeks.  He is compliant with his current medications.  He continues to work.  Currently he is on clobazam 10 mg nightly lamotrigine 400 mg in the morning and 500 mg at night levetiracetam 1000 mg twice daily.   HISTORY OF PRESENT ILLNESS:  This is a 21 year old gentleman with past medical history of generalized seizure, autism spectrum disorder and depression who is presenting for management of his epilepsy.  Patient was previously under the care of Dr. Sharene SkeansHickling for his seizure disorder.  Per chart review he has his first seizure in 2012  described as generalized tonic-clonic, at that time he had a EEG that showed high amplitude generalized spikes, polyspike or slow wave discharges that occur singly at 1 to 2 seconds of cluster.  He did have a second seizure in March 2013 while traveling in car with his father there was flickering of some light to the trees.  He began to blink, stare and then has a generalized tonic-clonic seizure.  At that time he had a head CT which was normal.  He was initially started on Depakote but did not tolerate due to agitation. Currently he is on Keppra 1000 mg twice daily and lamotrigine 400 mg in the morning and 500 mg at night.  He is also on Klonopin 1.25 mg at bedtime.  Grandmother reports that the last seizure occurred 2885-month ago while he was attending a car race.  He reported there was light on the track but the cars themselves did not have any headlights.  Prior to that his previous seizure was a couple weeks before.  They do not have a rescue medication.   Handedness: Right handed   Seizure Type: Focal seizures and generalized seizures  Current frequency: Last seizure 2 months   Any injuries from seizures: None   Seizure risk factors: Autism,   Previous ASMs: Depakote, Klonopin  Currenty ASMs: Lamotrigine 400 mg AM and 500 mg PM, Levetiracetam 1000 mg BID and Clobazam 10 mg at bedtime   ASMs side effects: None   Brain Images: normal Heat CT   Previous EEGs: High amplitude generalized spike  polyspike and slow wave   OTHER MEDICAL CONDITIONS: Generalized epilepsy, ADHD, autism, depression  REVIEW OF SYSTEMS: Full 14 system review of systems performed and negative with exception of: As noted in the HPI  ALLERGIES: Allergies  Allergen Reactions   Strawberry Extract Rash    HOME MEDICATIONS: Outpatient Medications Prior to Visit  Medication Sig Dispense Refill   acetaminophen (TYLENOL) 500 MG tablet Take 500 mg by mouth every 6 (six) hours as needed for mild pain.     Melatonin  10 MG CAPS Take 1 capsule by mouth at bedtime.     Vitamin D, Ergocalciferol, (DRISDOL) 1.25 MG (50000 UNIT) CAPS capsule Take 1 capsule (50,000 Units total) by mouth every 7 (seven) days. 8 capsule 0   amantadine (SYMMETREL) 100 MG capsule TAKE 1 CAPSULE BY MOUTH TWICE A DAY 60 capsule 0   cloBAZam (ONFI) 10 MG tablet TAKE 1 TABLET BY MOUTH EVERYDAY AT BEDTIME 30 tablet 5   lamoTRIgine (LAMICTAL) 100 MG tablet 4 TABLETS (400MG ) IN THE MORNING AND 5 TABLETS (500MG ) AT NIGHT 270 tablet 5   levETIRAcetam (KEPPRA) 500 MG tablet TAKE 2 TABLETS BY MOUTH TWICE A DAY 360 tablet 1   No facility-administered medications prior to visit.    PAST MEDICAL HISTORY: Past Medical History:  Diagnosis Date   ADHD (attention deficit hyperactivity disorder)    Autism    MDD (major depressive disorder)    Post traumatic stress disorder (PTSD)    Seasonal allergies    Seizures (HCC)    Social anxiety disorder of childhood     PAST SURGICAL HISTORY: Past Surgical History:  Procedure Laterality Date   CIRCUMCISION  2002    FAMILY HISTORY: Family History  Problem Relation Age of Onset   Other Mother        Slow Learner   Seizures Mother        Had Seizures Due to Head Trauma   Other Father        Slow Learner   Asthma Sister    Seizures Maternal Grandmother        Generalized Seizures   Heart attack Maternal Grandfather        Died at 77    SOCIAL HISTORY: Social History   Socioeconomic History   Marital status: Single    Spouse name: Not on file   Number of children: Not on file   Years of education: Not on file   Highest education level: Not on file  Occupational History   Not on file  Tobacco Use   Smoking status: Never   Smokeless tobacco: Never  Vaping Use   Vaping Use: Every day  Substance and Sexual Activity   Alcohol use: No   Drug use: No   Sexual activity: Never  Other Topics Concern   Not on file  Social History Narrative   [place] is no longer in school.   He  lives with his mother.   He enjoys working with his hands, mowing, weed eating, drawing, and playing on his phone.   Social Determinants of Health   Financial Resource Strain: Not on file  Food Insecurity: Not on file  Transportation Needs: Not on file  Physical Activity: Not on file  Stress: Not on file  Social Connections: Not on file  Intimate Partner Violence: Not on file     PHYSICAL EXAM  GENERAL EXAM/CONSTITUTIONAL: Vitals:  Vitals:   05/23/22 1443  BP: 124/80  Pulse: 82  Weight: 181 lb 8 oz (82.3  kg)   Body mass index is 25.31 kg/m. Wt Readings from Last 3 Encounters:  05/23/22 181 lb 8 oz (82.3 kg)  10/05/21 190 lb (86.2 kg)  08/17/21 182 lb 15.7 oz (83 kg)   Patient is in no distress; well developed, nourished and groomed; neck is supple  EYES: Pupils round and reactive to light, Visual fields full to confrontation, Extraocular movements intacts,  No results found.  MUSCULOSKELETAL: Gait, strength, tone, movements noted in Neurologic exam below  NEUROLOGIC: MENTAL STATUS:      No data to display         awake, alert, oriented to person, place and time recent and remote memory intact normal attention and concentration language fluent, comprehension intact, naming intact   CRANIAL NERVE:  2nd, 3rd, 4th, 6th - pupils equal and reactive to light, visual fields full to confrontation, extraocular muscles intact, no nystagmus 5th - facial sensation symmetric 7th - facial strength symmetric 8th - hearing intact 9th - palate elevates symmetrically, uvula midline 11th - shoulder shrug symmetric 12th - tongue protrusion midline  MOTOR:  normal bulk and tone, full strength in the BUE, BLE  SENSORY:  normal and symmetric to light touch, pinprick, temperature, vibration  COORDINATION:  finger-nose-finger, fine finger movements normal  REFLEXES:  deep tendon reflexes present and symmetric  GAIT/STATION:  normal   DIAGNOSTIC DATA (LABS,  IMAGING, TESTING) - I reviewed patient records, labs, notes, testing and imaging myself where available.  Lab Results  Component Value Date   WBC 6.8 08/10/2021   HGB 16.3 08/10/2021   HCT 46.8 08/10/2021   MCV 90.2 08/10/2021   PLT 293 08/10/2021      Component Value Date/Time   NA 141 10/05/2021 1511   K 4.1 10/05/2021 1511   CL 101 10/05/2021 1511   CO2 26 10/05/2021 1511   GLUCOSE 95 10/05/2021 1511   GLUCOSE 110 (H) 08/10/2021 0133   BUN 5 (L) 10/05/2021 1511   CREATININE 1.18 10/05/2021 1511   CALCIUM 10.2 10/05/2021 1511   PROT 8.1 08/10/2021 0133   ALBUMIN 4.7 08/10/2021 0133   AST 24 08/10/2021 0133   ALT 31 08/10/2021 0133   ALKPHOS 98 08/10/2021 0133   BILITOT 0.9 08/10/2021 0133   GFRNONAA >60 08/10/2021 0133   GFRAA >60 06/22/2019 1431   No results found for: "CHOL", "HDL", "LDLCALC", "LDLDIRECT", "TRIG" No results found for: "HGBA1C" No results found for: "VITAMINB12" Lab Results  Component Value Date   TSH 3.194 12/29/2010     Head CT 2012 Normal head CT   EEG 2015 This is a abnormal record with the patient awake.  Is characterized by generalized spike and slow-wave activity and bilateral occipital spike and slow-wave discharges that are epileptogenic from an electrographic viewpoint.  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  I personally reviewed brain Images and previous EEG reports.   ASSESSMENT AND PLAN  21 y.o. year old male  with past medical history of generalized epilepsy, mild intellectual disability, autism spectrum disorder and depression here for follow up.  He is on 3 AED including lamotrigine 400 mg in the morning and 500 mg at night, levetiracetam 1000 mg twice daily and clobazam 10 mg nightly.  He continued to have breakthrough seizure that may be related to kratom use.  Last seizure was yesterday when grandmother described two severe seizures.  At this time, instead of going up on the  medication, I will initiate cenobamate, we will start a  titration up to 100 mg in 8 weeks and I will see the patient at that time to obtain labs; based on lab result we will likely decrease some of his antiseizure medication dose.  I also advised grandmother that during this time to please contact me for any change whatsoever.  She voices understanding.  I will see him in about 8 weeks.    1. Partial epilepsy with impairment of consciousness, not intractable (HCC)   2. Generalized convulsive epilepsy (HCC)   3. Intermittent explosive disorder      PLAN: Continue with current medications   Keppra 1000 mg twice a day   Lamictal 400 mg in the morning and 500 mg at night   Clobazam 10 mg nightly  Will start Cenobamate 12.5 mg daily for 2 weeks with plan to increase every 2 weeks up to 100 mg daily. I will see him in 8 weeks for lab works.  Nayzilam to use for seizure lasting more than 5 minutes.      Per [facility] statutes, patients with seizures are not allowed to drive until they have been seizure-free for six months.  Other recommendations include using caution when using heavy equipment or power tools. Avoid working on ladders or at heights. Take showers instead of baths.  Do not swim alone.  Ensure the water temperature is not too high on the home water heater. Do not go swimming alone. Do not lock yourself in a room alone (i.e. bathroom). When caring for infants or small children, sit down when holding, feeding, or changing them to minimize risk of injury to the child in the event you have a seizure. Maintain good sleep hygiene. Avoid alcohol.  Also recommend adequate sleep, hydration, good diet and minimize stress.   During the Seizure  - First, ensure adequate ventilation and place patients on the floor on their left side  Loosen clothing around the neck and ensure the airway is patent. If the patient is clenching the teeth, do not force the mouth open with any object as this  can cause severe damage - Remove all items from the surrounding that can be hazardous. The patient may be oblivious to what's happening and may not even know what he or she is doing. If the patient is confused and wandering, either gently guide him/her away and block access to outside areas - Reassure the individual and be comforting - Call 911. In most cases, the seizure ends before EMS arrives. However, there are cases when seizures may last over 3 to 5 minutes. Or the individual may have developed breathing difficulties or severe injuries. If a pregnant patient or a person with diabetes develops a seizure, it is prudent to call an ambulance. - Finally, if the patient does not regain full consciousness, then call EMS. Most patients will remain confused for about 45 to 90 minutes after a seizure, so you must use judgment in calling for help. - Avoid restraints but make sure the patient is in a bed with padded side rails - Place the individual in a lateral position with the neck slightly flexed; this will help the saliva drain from the mouth and prevent the tongue from falling backward - Remove all nearby furniture and other hazards from the area - Provide verbal assurance as the individual is regaining consciousness - Provide the patient with privacy if possible - Call for help and start treatment as ordered by the caregiver   After the Seizure (Postictal Stage)  After a  seizure, most patients experience confusion, fatigue, muscle pain and/or a headache. Thus, one should permit the individual to sleep. For the next few days, reassurance is essential. Being calm and helping reorient the person is also of importance.  Most seizures are painless and end spontaneously. Seizures are not harmful to others but can lead to complications such as stress on the lungs, brain and the heart. Individuals with prior lung problems may develop labored breathing and respiratory distress.     No orders of the  defined types were placed in this encounter.   Meds ordered this encounter  Medications   Cenobamate 14 x 12.5 MG & 14 x 25 MG TBPK    Sig: Take 12.5 mg by mouth daily for 14 days, THEN 25 mg daily for 14 days.    Dispense:  28 each    Refill:  0   Cenobamate 14 x 50 MG & 14 x100 MG TBPK    Sig: Take 50 mg by mouth daily.    Dispense:  28 each    Refill:  0   amantadine (SYMMETREL) 100 MG capsule    Sig: TAKE 1 CAPSULE BY MOUTH TWICE A DAY    Dispense:  60 capsule    Refill:  0   cloBAZam (ONFI) 10 MG tablet    Sig: Take 1 tablet (10 mg total) by mouth at bedtime.    Dispense:  30 tablet    Refill:  5    This request is for a new prescription for a controlled substance as required by Federal/State law..   lamoTRIgine (LAMICTAL) 100 MG tablet    Sig: 400 mg AM and 500 PM    Dispense:  270 tablet    Refill:  5   levETIRAcetam (KEPPRA) 500 MG tablet    Sig: Take 2 tablets (1,000 mg total) by mouth 2 (two) times daily.    Dispense:  360 tablet    Refill:  1    Return in about 7 weeks (around 07/12/2022).    Windell Norfolk, MD 05/23/2022, 5:34 PM  [facility] [address], [address] [place], [place] [address] [phone]

## 2022-05-25 ENCOUNTER — Ambulatory Visit: Payer: Medicaid Other | Admitting: Neurology

## 2022-06-19 ENCOUNTER — Other Ambulatory Visit: Payer: Self-pay | Admitting: Neurology

## 2022-06-19 DIAGNOSIS — F6381 Intermittent explosive disorder: Secondary | ICD-10-CM

## 2022-06-22 ENCOUNTER — Telehealth: Payer: Self-pay | Admitting: Neurology

## 2022-06-22 DIAGNOSIS — F6381 Intermittent explosive disorder: Secondary | ICD-10-CM

## 2022-06-22 MED ORDER — AMANTADINE HCL 100 MG PO CAPS: ORAL_CAPSULE | ORAL | 2 refills | Status: DC

## 2022-06-22 NOTE — Telephone Encounter (Signed)
Refill has been sent.  °

## 2022-06-22 NOTE — Telephone Encounter (Signed)
Pt's grandmother, [name] request refill foamantadine (SYMMETREL) 100 MG capsuler at [facility] [phone]

## 2022-07-13 ENCOUNTER — Encounter: Payer: Self-pay | Admitting: Neurology

## 2022-07-13 ENCOUNTER — Ambulatory Visit (INDEPENDENT_AMBULATORY_CARE_PROVIDER_SITE_OTHER): Payer: Medicaid Other | Admitting: Neurology

## 2022-07-13 VITALS — BP 113/77 | HR 69 | Ht 71.0 in | Wt 176.0 lb

## 2022-07-13 DIAGNOSIS — F70 Mild intellectual disabilities: Secondary | ICD-10-CM

## 2022-07-13 DIAGNOSIS — G40309 Generalized idiopathic epilepsy and epileptic syndromes, not intractable, without status epilepticus: Secondary | ICD-10-CM | POA: Diagnosis not present

## 2022-07-13 DIAGNOSIS — G40319 Generalized idiopathic epilepsy and epileptic syndromes, intractable, without status epilepticus: Secondary | ICD-10-CM | POA: Diagnosis not present

## 2022-07-13 MED ORDER — VALTOCO 15 MG DOSE 7.5 MG/0.1ML NA LQPK: 15.0000 mg | NASAL | 5 refills | Status: AC | PRN

## 2022-07-13 NOTE — Patient Instructions (Signed)
Continue with current medications   Keppra 1000 mg twice a day   Lamictal 400 mg in the morning and 500 mg at night   Clobazam 10 mg nightly  Will give Valtoco as rescue medication  Follow up in 3 months or sooner if worse

## 2022-07-13 NOTE — Progress Notes (Signed)
[facility]  PATIENT: Zachary [name] DOB: 05-10-2001  REFERRING CLINICIAN: Corrington, Kip A, MD HISTORY FROM: Patient and Grandmother  REASON FOR VISIT: Seizures    HISTORICAL  CHIEF COMPLAINT:  Chief Complaint  Patient presents with   Follow-up    Rm 12. Accompanied by grandmother. D/c cenobamate due to issues with aggression.   INTERVAL HISTORY 07/12/22:  Zachary [name] presents today with grandmother. At last visit, plan was to start Cenobamate, after taking the medication for about  week, he has worsening behavior, increase aggression and the medication was discontinue and his behavior is back to normal. He denies any additional seizure since last visit. Currently he is on Keppra, Lamotrigine and Clobazam.  He has not completed the EEG yet.    INTERVAL HISTORY 05/23/2022:  Zachary [name] presents today for follow-up.  He is accompanied by his grandmother.  Last visit was in October, at that time I started him on clobazam.  Grandmother reports since starting the clobazam he has been doing well actually, was seizure-free up for about 3 months after that.  But seizure return at 2 months ago during this time also Zachary [name] was noted to start using kratom. Grandmother was not aware of it until later when Zachary [name] told him about the Kratom.  He reported that yesterday he had 2 strong generalized convulsions back-to-back each lasting about 3 minutes.  She did not take him to the emergency department.  Armstrong reported he has not taken the kratom for about 2 weeks.  He is compliant with his current medications.  He continues to work.  Currently he is on clobazam 10 mg nightly lamotrigine 400 mg in the morning and 500 mg at night levetiracetam 1000 mg twice daily.   HISTORY OF PRESENT ILLNESS:  This is a 21 year old gentleman with past medical history of generalized seizure, autism spectrum disorder and depression who is presenting for management of his epilepsy.  Patient was previously under  the care of Dr. Sharene Skeans for his seizure disorder.  Per chart review he has his first seizure in 2012 described as generalized tonic-clonic, at that time he had a EEG that showed high amplitude generalized spikes, polyspike or slow wave discharges that occur singly at 1 to 2 seconds of cluster.  He did have a second seizure in March 2013 while traveling in car with his father there was flickering of some light to the trees.  He began to blink, stare and then has a generalized tonic-clonic seizure.  At that time he had a head CT which was normal.  He was initially started on Depakote but did not tolerate due to agitation. Currently he is on Keppra 1000 mg twice daily and lamotrigine 400 mg in the morning and 500 mg at night.  He is also on Klonopin 1.25 mg at bedtime.  Grandmother reports that the last seizure occurred 91-month ago while he was attending a car race.  He reported there was light on the track but the cars themselves did not have any headlights.  Prior to that his previous seizure was a couple weeks before.  They do not have a rescue medication.   Handedness: Right handed   Seizure Type: Focal seizures and generalized seizures  Current frequency: Last seizure 2 months   Any injuries from seizures: None   Seizure risk factors: Autism,   Previous ASMs: Depakote, Klonopin  Currenty ASMs: Lamotrigine 400 mg AM and 500 mg PM, Levetiracetam 1000 mg BID and Clobazam 10 mg at bedtime   ASMs  side effects: None   Brain Images: normal Heat CT   Previous EEGs: High amplitude generalized spike polyspike and slow wave   OTHER MEDICAL CONDITIONS: Generalized epilepsy, ADHD, autism, depression  REVIEW OF SYSTEMS: Full 14 system review of systems performed and negative with exception of: As noted in the HPI  ALLERGIES: Allergies  Allergen Reactions   Strawberry Extract Rash    HOME MEDICATIONS: Outpatient Medications Prior to Visit  Medication Sig Dispense Refill   acetaminophen  (TYLENOL) 500 MG tablet Take 500 mg by mouth every 6 (six) hours as needed for mild pain.     amantadine (SYMMETREL) 100 MG capsule TAKE 1 CAPSULE BY MOUTH TWICE A DAY 60 capsule 2   cloBAZam (ONFI) 10 MG tablet Take 1 tablet (10 mg total) by mouth at bedtime. 30 tablet 5   lamoTRIgine (LAMICTAL) 100 MG tablet 400 mg AM and 500 PM 270 tablet 5   levETIRAcetam (KEPPRA) 500 MG tablet Take 2 tablets (1,000 mg total) by mouth 2 (two) times daily. 360 tablet 1   Melatonin 10 MG CAPS Take 1 capsule by mouth at bedtime.     Vitamin D, Ergocalciferol, (DRISDOL) 1.25 MG (50000 UNIT) CAPS capsule Take 1 capsule (50,000 Units total) by mouth every 7 (seven) days. 8 capsule 0   clonazePAM (KLONOPIN) 0.5 MG tablet Take 1.25 mg by mouth daily. (Patient not taking: Reported on 07/13/2022)     Cenobamate 14 x 50 MG & 14 x100 MG TBPK Take 50 mg by mouth daily. 28 each 0   No facility-administered medications prior to visit.    PAST MEDICAL HISTORY: Past Medical History:  Diagnosis Date   ADHD (attention deficit hyperactivity disorder)    Autism    MDD (major depressive disorder)    Post traumatic stress disorder (PTSD)    Seasonal allergies    Seizures (HCC)    Social anxiety disorder of childhood     PAST SURGICAL HISTORY: Past Surgical History:  Procedure Laterality Date   CIRCUMCISION  2002    FAMILY HISTORY: Family History  Problem Relation Age of Onset   Other Mother        Slow Learner   Seizures Mother        Had Seizures Due to Head Trauma   Other Father        Slow Learner   Asthma Sister    Seizures Maternal Grandmother        Generalized Seizures   Heart attack Maternal Grandfather        Died at 18    SOCIAL HISTORY: Social History   Socioeconomic History   Marital status: Single    Spouse name: Not on file   Number of children: Not on file   Years of education: Not on file   Highest education level: Not on file  Occupational History   Not on file  Tobacco Use    Smoking status: Never   Smokeless tobacco: Never  Vaping Use   Vaping Use: Every day  Substance and Sexual Activity   Alcohol use: No   Drug use: No   Sexual activity: Never  Other Topics Concern   Not on file  Social History Narrative   Courage is no longer in school.   He lives with his mother.   He enjoys working with his hands, mowing, weed eating, drawing, and playing on his phone.   Social Determinants of Health   Financial Resource Strain: Not on file  Food Insecurity: Not on  file  Transportation Needs: Not on file  Physical Activity: Not on file  Stress: Not on file  Social Connections: Not on file  Intimate Partner Violence: Not on file     PHYSICAL EXAM  GENERAL EXAM/CONSTITUTIONAL: Vitals:  Vitals:   07/13/22 1402  BP: 113/77  Pulse: 69  Weight: 176 lb (79.8 kg)  Height: 5\' 11"  (1.803 m)    Body mass index is 24.55 kg/m. Wt Readings from Last 3 Encounters:  07/13/22 176 lb (79.8 kg)  05/23/22 181 lb 8 oz (82.3 kg)  10/05/21 190 lb (86.2 kg)   Patient is in no distress; well developed, nourished and groomed; neck is supple  EYES: Pupils round and reactive to light, Visual fields full to confrontation, Extraocular movements intacts,  No results found.  MUSCULOSKELETAL: Gait, strength, tone, movements noted in Neurologic exam below  NEUROLOGIC: MENTAL STATUS:      No data to display         awake, alert, oriented to person, place and time recent and remote memory intact normal attention and concentration language fluent, comprehension intact, naming intact   CRANIAL NERVE:  2nd, 3rd, 4th, 6th - pupils equal and reactive to light, visual fields full to confrontation, extraocular muscles intact, no nystagmus 5th - facial sensation symmetric 7th - facial strength symmetric 8th - hearing intact 9th - palate elevates symmetrically, uvula midline 11th - shoulder shrug symmetric 12th - tongue protrusion midline  MOTOR:  normal bulk and  tone, full strength in the BUE, BLE  SENSORY:  normal and symmetric to light touch, pinprick, temperature, vibration  COORDINATION:  finger-nose-finger, fine finger movements normal  REFLEXES:  deep tendon reflexes present and symmetric  GAIT/STATION:  normal   DIAGNOSTIC DATA (LABS, IMAGING, TESTING) - I reviewed patient records, labs, notes, testing and imaging myself where available.  Lab Results  Component Value Date   WBC 6.8 08/10/2021   HGB 16.3 08/10/2021   HCT 46.8 08/10/2021   MCV 90.2 08/10/2021   PLT 293 08/10/2021      Component Value Date/Time   NA 141 10/05/2021 1511   K 4.1 10/05/2021 1511   CL 101 10/05/2021 1511   CO2 26 10/05/2021 1511   GLUCOSE 95 10/05/2021 1511   GLUCOSE 110 (H) 08/10/2021 0133   BUN 5 (L) 10/05/2021 1511   CREATININE 1.18 10/05/2021 1511   CALCIUM 10.2 10/05/2021 1511   PROT 8.1 08/10/2021 0133   ALBUMIN 4.7 08/10/2021 0133   AST 24 08/10/2021 0133   ALT 31 08/10/2021 0133   ALKPHOS 98 08/10/2021 0133   BILITOT 0.9 08/10/2021 0133   GFRNONAA >60 08/10/2021 0133   GFRAA >60 06/22/2019 1431   No results found for: "CHOL", "HDL", "LDLCALC", "LDLDIRECT", "TRIG" No results found for: "HGBA1C" No results found for: "VITAMINB12" Lab Results  Component Value Date   TSH 3.194 12/29/2010    Head CT 2012 Normal head CT   EEG 2015 This is a abnormal record with the patient awake.  Is characterized by generalized spike and slow-wave activity and bilateral occipital spike and slow-wave discharges that are epileptogenic from an electrographic viewpoint.  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   ASSESSMENT AND PLAN  21 y.o. year old male  with past medical history of generalized epilepsy, mild intellectual disability, autism spectrum disorder and depression here for follow up.  He is on 3 AED including lamotrigine 400 mg in the morning and 500 mg at night, levetiracetam 1000 mg  twice  daily and clobazam 10 mg nightly. Attempted to add Cenobamate but he did have increase aggression therefore medication discontinue. No seizure since last visit. We will continue his current medication and if he has breakthrough seizure, plan will be to increase  the Clobazam.     1. Generalized convulsive epilepsy (HCC)   2. Mild intellectual disability      PLAN: Continue with current medications   Keppra 1000 mg twice a day   Lamictal 400 mg in the morning and 500 mg at night   Clobazam 10 mg nightly  Will give Valtoco as rescue medication  Follow up in 3 months or sooner if worse    Per [facility] statutes, patients with seizures are not allowed to drive until they have been seizure-free for six months.  Other recommendations include using caution when using heavy equipment or power tools. Avoid working on ladders or at heights. Take showers instead of baths.  Do not swim alone.  Ensure the water temperature is not too high on the home water heater. Do not go swimming alone. Do not lock yourself in a room alone (i.e. bathroom). When caring for infants or small children, sit down when holding, feeding, or changing them to minimize risk of injury to the child in the event you have a seizure. Maintain good sleep hygiene. Avoid alcohol.  Also recommend adequate sleep, hydration, good diet and minimize stress.   During the Seizure  - First, ensure adequate ventilation and place patients on the floor on their left side  Loosen clothing around the neck and ensure the airway is patent. If the patient is clenching the teeth, do not force the mouth open with any object as this can cause severe damage - Remove all items from the surrounding that can be hazardous. The patient may be oblivious to what's happening and may not even know what he or she is doing. If the patient is confused and wandering, either gently guide him/her away and block access to outside areas - Reassure the individual  and be comforting - Call 911. In most cases, the seizure ends before EMS arrives. However, there are cases when seizures may last over 3 to 5 minutes. Or the individual may have developed breathing difficulties or severe injuries. If a pregnant patient or a person with diabetes develops a seizure, it is prudent to call an ambulance. - Finally, if the patient does not regain full consciousness, then call EMS. Most patients will remain confused for about 45 to 90 minutes after a seizure, so you must use judgment in calling for help. - Avoid restraints but make sure the patient is in a bed with padded side rails - Place the individual in a lateral position with the neck slightly flexed; this will help the saliva drain from the mouth and prevent the tongue from falling backward - Remove all nearby furniture and other hazards from the area - Provide verbal assurance as the individual is regaining consciousness - Provide the patient with privacy if possible - Call for help and start treatment as ordered by the caregiver   After the Seizure (Postictal Stage)  After a seizure, most patients experience confusion, fatigue, muscle pain and/or a headache. Thus, one should permit the individual to sleep. For the next few days, reassurance is essential. Being calm and helping reorient the person is also of importance.  Most seizures are painless and end spontaneously. Seizures are not harmful to others but can lead to complications  such as stress on the lungs, brain and the heart. Individuals with prior lung problems may develop labored breathing and respiratory distress.     No orders of the defined types were placed in this encounter.   No orders of the defined types were placed in this encounter.   Return in about 3 months (around 10/13/2022).   I have spent a total of 30 minutes dedicated to this patient today, preparing to see patient, performing a medically appropriate examination and evaluation,  ordering tests and/or medications and procedures, and counseling and educating the patient/family/caregiver; independently interpreting result and communicating results to the family/patient/caregiver; and documenting clinical information in the electronic medical record.   Windell Norfolk, MD 07/13/2022, 3:21 PM  [facility] [address], [address] [place], [place] [address] [phone]

## 2022-07-14 ENCOUNTER — Telehealth: Payer: Self-pay | Admitting: Neurology

## 2022-07-14 NOTE — Telephone Encounter (Signed)
She is interested in [name], Will proceed with setting up appointment with Meghan. Thanks

## 2022-07-14 NOTE — Telephone Encounter (Signed)
Pt's grandmother [name] called and stated that they would like to go ahead and proceed with the treatment that was recommended in the last appt. Please advise.

## 2022-07-24 ENCOUNTER — Other Ambulatory Visit: Payer: Self-pay | Admitting: Neurology

## 2022-07-24 DIAGNOSIS — F6381 Intermittent explosive disorder: Secondary | ICD-10-CM

## 2022-09-01 ENCOUNTER — Other Ambulatory Visit: Payer: Self-pay | Admitting: Neurosurgery

## 2022-09-06 NOTE — Pre-Procedure Instructions (Signed)
Surgical Instructions    Your procedure is scheduled on September 12, 2022.  Report to [facility] Main Entrance "A" at 5:30 A.M., then check in with the Admitting office.  Call this number if you have problems the morning of surgery:  [phone]   If you have any questions prior to your surgery date call [phone]: Open Monday-Friday 8am-4pm    Remember:  Do not eat or drink after midnight the night before your surgery     Take these medicines the morning of surgery with A SIP OF WATER:  lamoTRIgine (LAMICTAL)  levETIRAcetam (KEPPRA)  amantadine (SYMMETREL)    Take these medicines the morning of surgery with a sip of water AS NEEDED:  acetaminophen (TYLENOL)  diazePAM    As of today, STOP taking any Aspirin (unless otherwise instructed by your surgeon) Aleve, Naproxen, Ibuprofen, Motrin, Advil, Goody's, BC's, all herbal medications, fish oil, and all vitamins.                     Do NOT Smoke (Tobacco/Vaping) for 24 hours prior to your procedure.  If you use a CPAP at night, you may bring your mask/headgear for your overnight stay.   Contacts, glasses, piercing's, hearing aid's, dentures or partials may not be worn into surgery, please bring cases for these belongings.    For patients admitted to the hospital, discharge time will be determined by your treatment team.   Patients discharged the day of surgery will not be allowed to drive home, and someone needs to stay with them for 24 hours.  SURGICAL WAITING ROOM VISITATION Patients having surgery or a procedure may have no more than 2 support people in the waiting area - these visitors may rotate.   Children under the age of 16 must have an adult with them who is not the patient. If the patient needs to stay at the hospital during part of their recovery, the visitor guidelines for inpatient rooms apply. Pre-op nurse will coordinate an appropriate time for 1 support person to accompany patient in pre-op.  This support  person may not rotate.   Please refer to the [facility] website for the visitor guidelines for Inpatients (after your surgery is over and you are in a regular room).    Special instructions:   [facility]- Preparing For Surgery  Before surgery, you can play an important role. Because skin is not sterile, your skin needs to be as free of germs as possible. You can reduce the number of germs on your skin by washing with CHG (chlorahexidine gluconate) Soap before surgery.  CHG is an antiseptic cleaner which kills germs and bonds with the skin to continue killing germs even after washing.    Oral Hygiene is also important to reduce your risk of infection.  Remember - BRUSH YOUR TEETH THE MORNING OF SURGERY WITH YOUR REGULAR TOOTHPASTE  Please do not use if you have an allergy to CHG or antibacterial soaps. If your skin becomes reddened/irritated stop using the CHG.  Do not shave (including legs and underarms) for at least 48 hours prior to first CHG shower. It is OK to shave your face.  Please follow these instructions carefully.   Shower the NIGHT BEFORE SURGERY and the MORNING OF SURGERY  If you chose to wash your hair, wash your hair first as usual with your normal shampoo.  After you shampoo, rinse your hair and body thoroughly to remove the shampoo.  Use CHG Soap as you would any  other liquid soap. You can apply CHG directly to the skin and wash gently with a scrungie or a clean washcloth.   Apply the CHG Soap to your body ONLY FROM THE NECK DOWN.  Do not use on open wounds or open sores. Avoid contact with your eyes, ears, mouth and genitals (private parts). Wash Face and genitals (private parts)  with your normal soap.   Wash thoroughly, paying special attention to the area where your surgery will be performed.  Thoroughly rinse your body with warm water from the neck down.  DO NOT shower/wash with your normal soap after using and rinsing off the CHG Soap.  Pat yourself dry with a  CLEAN TOWEL.  Wear CLEAN PAJAMAS to bed the night before surgery  Place CLEAN SHEETS on your bed the night before your surgery  DO NOT SLEEP WITH PETS.   Day of Surgery: Take a shower with CHG soap. Do not wear jewelry or makeup Do not wear lotions, powders, perfumes/colognes, or deodorant. Do not shave 48 hours prior to surgery.  Men may shave face and neck. Do not bring valuables to the hospital.  [facility] is not responsible for any belongings or valuables. Do not wear nail polish, gel polish, artificial nails, or any other type of covering on natural nails (fingers and toes) If you have artificial nails or gel coating that need to be removed by a nail salon, please have this removed prior to surgery. Artificial nails or gel coating may interfere with anesthesia's ability to adequately monitor your vital signs.  Wear Clean/Comfortable clothing the morning of surgery Remember to brush your teeth WITH YOUR REGULAR TOOTHPASTE.   Please read over the following fact sheets that you were given.    If you received a COVID test during your pre-op visit  it is requested that you wear a mask when out in public, stay away from anyone that may not be feeling well and notify your surgeon if you develop symptoms. If you have been in contact with anyone that has tested positive in the last 10 days please notify you surgeon.

## 2022-09-07 ENCOUNTER — Encounter (HOSPITAL_COMMUNITY): Payer: Self-pay

## 2022-09-07 ENCOUNTER — Encounter (HOSPITAL_COMMUNITY)
Admission: RE | Admit: 2022-09-07 | Discharge: 2022-09-07 | Disposition: A | Discharge status: Home / Self Care | Payer: Medicaid Other | Source: Ambulatory Visit | Attending: Neurosurgery | Admitting: Neurosurgery

## 2022-09-07 ENCOUNTER — Other Ambulatory Visit: Payer: Self-pay | Admitting: Neurology

## 2022-09-07 ENCOUNTER — Other Ambulatory Visit: Payer: Self-pay

## 2022-09-07 VITALS — BP 110/76 | HR 76 | Temp 98.1°F | Resp 18 | Ht 66.0 in | Wt 182.4 lb

## 2022-09-07 DIAGNOSIS — Z01818 Encounter for other preprocedural examination: Secondary | ICD-10-CM

## 2022-09-07 DIAGNOSIS — G988 Other disorders of nervous system: Secondary | ICD-10-CM | POA: Diagnosis not present

## 2022-09-07 DIAGNOSIS — F6381 Intermittent explosive disorder: Secondary | ICD-10-CM

## 2022-09-07 DIAGNOSIS — Z01812 Encounter for preprocedural laboratory examination: Secondary | ICD-10-CM | POA: Insufficient documentation

## 2022-09-07 LAB — CBC
HCT: 44.8 % (ref 39.0–52.0)
Hemoglobin: 15.9 g/dL (ref 13.0–17.0)
MCH: 31.1 pg (ref 26.0–34.0)
MCHC: 35.5 g/dL (ref 30.0–36.0)
MCV: 87.5 fL (ref 80.0–100.0)
Platelets: 246 10*3/uL (ref 150–400)
RBC: 5.12 MIL/uL (ref 4.22–5.81)
RDW: 11.8 % (ref 11.5–15.5)
WBC: 6.2 10*3/uL (ref 4.0–10.5)
nRBC: 0 % (ref 0.0–0.2)

## 2022-09-07 LAB — BASIC METABOLIC PANEL
Anion gap: 5 (ref 5–15)
BUN: 9 mg/dL (ref 6–20)
CO2: 29 mmol/L (ref 22–32)
Calcium: 9.3 mg/dL (ref 8.9–10.3)
Chloride: 106 mmol/L (ref 98–111)
Creatinine, Ser: 1.13 mg/dL (ref 0.61–1.24)
GFR, Estimated: >60 mL/min (ref >60)
Glucose, Bld: 92 mg/dL (ref 70–99)
Potassium: 3.9 mmol/L (ref 3.5–5.1)
Sodium: 140 mmol/L (ref 135–145)

## 2022-09-07 NOTE — Progress Notes (Addendum)
PCP - Dr. Talbert Forest Corrington Cardiologist - Denies  PPM/ICD - Denies Device Orders - n/a Rep Notified - n/a  Chest x-ray - n/a EKG - n/a Stress Test - Denies  ECHO - Denies Cardiac Cath - Denies  Sleep Study - Denies CPAP - n/a  No DM  Blood Thinner Instructions: n/a Aspirin Instructions: n/a  NPO after midnight  COVID TEST- n/a   Anesthesia review: Yes. Intellectual Disability with neurological disorders.  Patient denies shortness of breath, fever, cough and chest pain at PAT appointment   All instructions explained to the patient, with a verbal understanding of the material. Patient agrees to go over the instructions while at home for a better understanding. Patient also instructed to self quarantine after being tested for COVID-19. The opportunity to ask questions was provided.

## 2022-09-08 NOTE — Telephone Encounter (Signed)
Rx refilled.

## 2022-09-11 NOTE — Anesthesia Preprocedure Evaluation (Signed)
Anesthesia Evaluation  Patient identified by MRN, date of birth, ID band Patient awake    Reviewed: Allergy & Precautions, H&P , NPO status , Patient's Chart, lab work & pertinent test results  Airway Mallampati: III  TM Distance: >3 FB Neck ROM: Full    Dental no notable dental hx. (+) Poor Dentition, Dental Advisory Given   Pulmonary neg pulmonary ROS, Patient abstained from smoking.,    Pulmonary exam normal breath sounds clear to auscultation       Cardiovascular Exercise Tolerance: Good negative cardio ROS   Rhythm:Regular Rate:Normal     Neuro/Psych Seizures -, Well Controlled,  Anxiety Depression    GI/Hepatic negative GI ROS, Neg liver ROS,   Endo/Other  negative endocrine ROS  Renal/GU negative Renal ROS  negative genitourinary   Musculoskeletal   Abdominal   Peds  Hematology negative hematology ROS (+)   Anesthesia Other Findings   Reproductive/Obstetrics negative OB ROS                            Anesthesia Physical Anesthesia Plan  ASA: 3  Anesthesia Plan: General   Post-op Pain Management: Tylenol PO (pre-op)*   Induction: Intravenous  PONV Risk Score and Plan: 3 and Ondansetron, Dexamethasone and Midazolam  Airway Management Planned: Oral ETT  Additional Equipment:   Intra-op Plan:   Post-operative Plan: Extubation in OR  Informed Consent: I have reviewed the patients History and Physical, chart, labs and discussed the procedure including the risks, benefits and alternatives for the proposed anesthesia with the patient or authorized representative who has indicated his/her understanding and acceptance.     Dental advisory given  Plan Discussed with: CRNA  Anesthesia Plan Comments:        Anesthesia Quick Evaluation

## 2022-09-12 ENCOUNTER — Ambulatory Visit (HOSPITAL_BASED_OUTPATIENT_CLINIC_OR_DEPARTMENT_OTHER): Payer: Medicaid Other | Admitting: Anesthesiology

## 2022-09-12 ENCOUNTER — Ambulatory Visit (HOSPITAL_COMMUNITY)
Admission: RE | Admit: 2022-09-12 | Discharge: 2022-09-12 | Disposition: A | Discharge status: Home / Self Care | Payer: Medicaid Other | Attending: Neurosurgery | Admitting: Neurosurgery

## 2022-09-12 ENCOUNTER — Other Ambulatory Visit: Payer: Self-pay

## 2022-09-12 ENCOUNTER — Encounter (HOSPITAL_COMMUNITY)
Admission: RE | Disposition: A | Discharge status: Home / Self Care | Payer: Self-pay | Source: Home / Self Care | Attending: Neurosurgery

## 2022-09-12 ENCOUNTER — Encounter (HOSPITAL_COMMUNITY): Payer: Self-pay | Admitting: Neurosurgery

## 2022-09-12 ENCOUNTER — Ambulatory Visit (HOSPITAL_COMMUNITY): Payer: Medicaid Other | Admitting: Physician Assistant

## 2022-09-12 DIAGNOSIS — F840 Autistic disorder: Secondary | ICD-10-CM | POA: Insufficient documentation

## 2022-09-12 DIAGNOSIS — G40804 Other epilepsy, intractable, without status epilepticus: Secondary | ICD-10-CM | POA: Diagnosis not present

## 2022-09-12 DIAGNOSIS — F419 Anxiety disorder, unspecified: Secondary | ICD-10-CM | POA: Insufficient documentation

## 2022-09-12 DIAGNOSIS — F32A Depression, unspecified: Secondary | ICD-10-CM | POA: Insufficient documentation

## 2022-09-12 DIAGNOSIS — F418 Other specified anxiety disorders: Secondary | ICD-10-CM | POA: Diagnosis not present

## 2022-09-12 DIAGNOSIS — F17290 Nicotine dependence, other tobacco product, uncomplicated: Secondary | ICD-10-CM | POA: Diagnosis not present

## 2022-09-12 DIAGNOSIS — G40919 Epilepsy, unspecified, intractable, without status epilepticus: Secondary | ICD-10-CM

## 2022-09-12 HISTORY — PX: VAGUS NERVE STIMULATOR INSERTION: SHX348

## 2022-09-12 SURGERY — VAGAL NERVE STIMULATOR IMPLANT
Anesthesia: General | Laterality: Left

## 2022-09-12 MED ORDER — ONDANSETRON HCL 4 MG/2ML IJ SOLN
INTRAMUSCULAR | Status: AC
  Filled 2022-09-12: qty 2

## 2022-09-12 MED ORDER — FENTANYL CITRATE (PF) 100 MCG/2ML IJ SOLN: 25.0000 ug | INTRAMUSCULAR | Status: DC | PRN

## 2022-09-12 MED ORDER — LIDOCAINE 2% (20 MG/ML) 5 ML SYRINGE
INTRAMUSCULAR | Status: AC
  Filled 2022-09-12: qty 5

## 2022-09-12 MED ORDER — ROCURONIUM BROMIDE 10 MG/ML (PF) SYRINGE
PREFILLED_SYRINGE | INTRAVENOUS | Status: AC
  Filled 2022-09-12: qty 10

## 2022-09-12 MED ORDER — PROPOFOL 10 MG/ML IV BOLUS
INTRAVENOUS | Status: DC | PRN
  Administered 2022-09-12: 200 mg via INTRAVENOUS

## 2022-09-12 MED ORDER — CHLORHEXIDINE GLUCONATE CLOTH 2 % EX PADS: 6.0000 | MEDICATED_PAD | Freq: Once | CUTANEOUS | Status: DC

## 2022-09-12 MED ORDER — FENTANYL CITRATE (PF) 250 MCG/5ML IJ SOLN
INTRAMUSCULAR | Status: DC | PRN
  Administered 2022-09-12: 25 ug via INTRAVENOUS
  Administered 2022-09-12: 100 ug via INTRAVENOUS
  Administered 2022-09-12 (×2): 25 ug via INTRAVENOUS
  Administered 2022-09-12: 50 ug via INTRAVENOUS
  Administered 2022-09-12: 25 ug via INTRAVENOUS

## 2022-09-12 MED ORDER — LIDOCAINE 2% (20 MG/ML) 5 ML SYRINGE
INTRAMUSCULAR | Status: DC | PRN
  Administered 2022-09-12: 100 mg via INTRAVENOUS

## 2022-09-12 MED ORDER — DEXAMETHASONE SODIUM PHOSPHATE 10 MG/ML IJ SOLN
INTRAMUSCULAR | Status: AC
  Filled 2022-09-12: qty 1

## 2022-09-12 MED ORDER — LIDOCAINE-EPINEPHRINE 1 %-1:100000 IJ SOLN
INTRAMUSCULAR | Status: AC
  Filled 2022-09-12: qty 1

## 2022-09-12 MED ORDER — SUCCINYLCHOLINE CHLORIDE 200 MG/10ML IV SOSY
PREFILLED_SYRINGE | INTRAVENOUS | Status: AC
  Filled 2022-09-12: qty 10

## 2022-09-12 MED ORDER — ROCURONIUM BROMIDE 10 MG/ML (PF) SYRINGE
PREFILLED_SYRINGE | INTRAVENOUS | Status: DC | PRN
  Administered 2022-09-12 (×2): 10 mg via INTRAVENOUS
  Administered 2022-09-12 (×3): 50 mg via INTRAVENOUS

## 2022-09-12 MED ORDER — LACTATED RINGERS IV SOLN: INTRAVENOUS | Status: DC

## 2022-09-12 MED ORDER — OXYCODONE HCL 5 MG PO TABS: 5.0000 mg | ORAL_TABLET | Freq: Four times a day (QID) | ORAL | 0 refills | Status: AC | PRN

## 2022-09-12 MED ORDER — MIDAZOLAM HCL 2 MG/2ML IJ SOLN
INTRAMUSCULAR | Status: AC
  Filled 2022-09-12: qty 2

## 2022-09-12 MED ORDER — CEFAZOLIN SODIUM-DEXTROSE 2-4 GM/100ML-% IV SOLN
2.0000 g | INTRAVENOUS | Status: AC
  Administered 2022-09-12: 2 g via INTRAVENOUS
  Filled 2022-09-12: qty 100

## 2022-09-12 MED ORDER — ORAL CARE MOUTH RINSE: 15.0000 mL | Freq: Once | OROMUCOSAL | Status: AC

## 2022-09-12 MED ORDER — BUPIVACAINE HCL (PF) 0.5 % IJ SOLN
INTRAMUSCULAR | Status: AC
  Filled 2022-09-12: qty 30

## 2022-09-12 MED ORDER — THROMBIN (RECOMBINANT) 5000 UNITS EX SOLR
CUTANEOUS | Status: AC
  Filled 2022-09-12: qty 5000

## 2022-09-12 MED ORDER — MIDAZOLAM HCL 5 MG/5ML IJ SOLN
INTRAMUSCULAR | Status: DC | PRN
  Administered 2022-09-12: 2 mg via INTRAVENOUS

## 2022-09-12 MED ORDER — THROMBIN 5000 UNITS EX SOLR
OROMUCOSAL | Status: DC | PRN
  Administered 2022-09-12: 5 mL via TOPICAL

## 2022-09-12 MED ORDER — ACETAMINOPHEN 500 MG PO TABS
1000.0000 mg | ORAL_TABLET | Freq: Once | ORAL | Status: AC
  Administered 2022-09-12: 1000 mg via ORAL
  Filled 2022-09-12: qty 2

## 2022-09-12 MED ORDER — SUGAMMADEX SODIUM 200 MG/2ML IV SOLN
INTRAVENOUS | Status: DC | PRN
  Administered 2022-09-12: 162.4 mg via INTRAVENOUS

## 2022-09-12 MED ORDER — FENTANYL CITRATE (PF) 250 MCG/5ML IJ SOLN
INTRAMUSCULAR | Status: AC
  Filled 2022-09-12: qty 5

## 2022-09-12 MED ORDER — DEXAMETHASONE SODIUM PHOSPHATE 10 MG/ML IJ SOLN
INTRAMUSCULAR | Status: DC | PRN
  Administered 2022-09-12: 10 mg via INTRAVENOUS

## 2022-09-12 MED ORDER — THROMBIN 5000 UNITS EX SOLR
CUTANEOUS | Status: AC
  Filled 2022-09-12: qty 5000

## 2022-09-12 MED ORDER — PROPOFOL 10 MG/ML IV BOLUS
INTRAVENOUS | Status: AC
  Filled 2022-09-12: qty 20

## 2022-09-12 MED ORDER — STERILE WATER FOR IRRIGATION IR SOLN
Status: DC | PRN
  Administered 2022-09-12: 1000 mL

## 2022-09-12 MED ORDER — ONDANSETRON HCL 4 MG/2ML IJ SOLN
INTRAMUSCULAR | Status: DC | PRN
  Administered 2022-09-12: 4 mg via INTRAVENOUS

## 2022-09-12 MED ORDER — 0.9 % SODIUM CHLORIDE (POUR BTL) OPTIME
TOPICAL | Status: DC | PRN
  Administered 2022-09-12: 1000 mL

## 2022-09-12 MED ORDER — BUPIVACAINE HCL 0.5 % IJ SOLN
INTRAMUSCULAR | Status: DC | PRN
  Administered 2022-09-12: 8 mL

## 2022-09-12 MED ORDER — LIDOCAINE-EPINEPHRINE 1 %-1:100000 IJ SOLN
INTRAMUSCULAR | Status: DC | PRN
  Administered 2022-09-12: 8 mL

## 2022-09-12 MED ORDER — CHLORHEXIDINE GLUCONATE 0.12 % MT SOLN
15.0000 mL | Freq: Once | OROMUCOSAL | Status: AC
  Administered 2022-09-12: 15 mL via OROMUCOSAL
  Filled 2022-09-12: qty 15

## 2022-09-12 SURGICAL SUPPLY — 50 items
BAG COUNTER SPONGE SURGICOUNT (BAG) ×1 IMPLANT
BAND RUBBER #18 3X1/16 STRL (MISCELLANEOUS) ×2 IMPLANT
BENZOIN TINCTURE PRP APPL 2/3 (GAUZE/BANDAGES/DRESSINGS) IMPLANT
BLADE CLIPPER SURG (BLADE) IMPLANT
BLADE SURG 11 STRL SS (BLADE) ×1 IMPLANT
CANISTER SUCT 3000ML PPV (MISCELLANEOUS) ×1 IMPLANT
DERMABOND ADVANCED .7 DNX12 (GAUZE/BANDAGES/DRESSINGS) ×1 IMPLANT
DRAPE CAMERA VIDEO/LASER (DRAPES) ×1 IMPLANT
DRAPE HALF SHEET 40X57 (DRAPES) IMPLANT
DRAPE LAPAROTOMY 100X72 PEDS (DRAPES) ×1 IMPLANT
DRAPE MICROSCOPE SLANT 54X150 (MISCELLANEOUS) ×1 IMPLANT
DRSG OPSITE POSTOP 3X4 (GAUZE/BANDAGES/DRESSINGS) IMPLANT
DRSG TEGADERM 4X4.75 (GAUZE/BANDAGES/DRESSINGS) ×4 IMPLANT
DURAPREP 6ML APPLICATOR 50/CS (WOUND CARE) ×1 IMPLANT
ELECT COATED BLADE 2.86 ST (ELECTRODE) ×1 IMPLANT
ELECT REM PT RETURN 9FT ADLT (ELECTROSURGICAL) ×1
ELECTRODE REM PT RTRN 9FT ADLT (ELECTROSURGICAL) ×1 IMPLANT
GAUZE 4X4 16PLY ~~LOC~~+RFID DBL (SPONGE) IMPLANT
GENERATOR MODEL 106 ASPIRE (Neuro Prosthesis/Implant) IMPLANT
GLOVE BIO SURGEON STRL SZ7.5 (GLOVE) ×1 IMPLANT
GLOVE BIOGEL PI IND STRL 7.5 (GLOVE) ×2 IMPLANT
GLOVE ECLIPSE 7.0 STRL STRAW (GLOVE) ×1 IMPLANT
GOWN STRL REUS W/ TWL LRG LVL3 (GOWN DISPOSABLE) ×2 IMPLANT
GOWN STRL REUS W/ TWL XL LVL3 (GOWN DISPOSABLE) IMPLANT
GOWN STRL REUS W/TWL 2XL LVL3 (GOWN DISPOSABLE) IMPLANT
GOWN STRL REUS W/TWL LRG LVL3 (GOWN DISPOSABLE) ×2
GOWN STRL REUS W/TWL XL LVL3 (GOWN DISPOSABLE)
HEMOSTAT POWDER KIT SURGIFOAM (HEMOSTASIS) ×1 IMPLANT
KIT BASIN OR (CUSTOM PROCEDURE TRAY) ×1 IMPLANT
KIT TURNOVER KIT B (KITS) ×1 IMPLANT
LEAD PERENNIAFLEX 2-3 304 (Neuro Prosthesis/Implant) IMPLANT
LOOP VESSEL MAXI BLUE (MISCELLANEOUS) IMPLANT
LOOP VESSEL MINI RED (MISCELLANEOUS) IMPLANT
NEEDLE HYPO 22GX1.5 SAFETY (NEEDLE) ×1 IMPLANT
NS IRRIG 1000ML POUR BTL (IV SOLUTION) ×1 IMPLANT
PACK LAMINECTOMY NEURO (CUSTOM PROCEDURE TRAY) ×1 IMPLANT
PAD ARMBOARD 7.5X6 YLW CONV (MISCELLANEOUS) ×3 IMPLANT
SPIKE FLUID TRANSFER (MISCELLANEOUS) ×1 IMPLANT
SPONGE INTESTINAL PEANUT (DISPOSABLE) IMPLANT
SPONGE SURGIFOAM ABS GEL SZ50 (HEMOSTASIS) IMPLANT
SUT ETHILON 3 0 FSL (SUTURE) IMPLANT
SUT NURALON 4 0 TR CR/8 (SUTURE) ×1 IMPLANT
SUT VIC AB 0 CT1 27 (SUTURE) ×1
SUT VIC AB 0 CT1 27XBRD ANBCTR (SUTURE) ×1 IMPLANT
SUT VIC AB 3-0 SH 8-18 (SUTURE) ×1 IMPLANT
SUT VICRYL 3-0 RB1 18 ABS (SUTURE) ×2 IMPLANT
TOWEL GREEN STERILE (TOWEL DISPOSABLE) ×1 IMPLANT
TOWEL GREEN STERILE FF (TOWEL DISPOSABLE) ×1 IMPLANT
TUNNELING TOOL (MISCELLANEOUS) IMPLANT
WATER STERILE IRR 1000ML POUR (IV SOLUTION) ×1 IMPLANT

## 2022-09-12 NOTE — Anesthesia Postprocedure Evaluation (Signed)
Anesthesia Post Note  Patient: Zachary [name]  Procedure(s) Performed: VAGAL NERVE STIMULATOR IMPLANT (Left)     Patient location during evaluation: PACU Anesthesia Type: General Level of consciousness: awake and alert Pain management: pain level controlled Vital Signs Assessment: post-procedure vital signs reviewed and stable Respiratory status: spontaneous breathing, nonlabored ventilation and respiratory function stable Cardiovascular status: blood pressure returned to baseline and stable Postop Assessment: no apparent nausea or vomiting Anesthetic complications: no   No notable events documented.  Last Vitals:  Vitals:   09/12/22 1115 09/12/22 1130  BP: 127/88 128/86  Pulse: (!) 106 (!) 104  Resp: 14 12  Temp:    SpO2: 97% 95%    Last Pain:  Vitals:   09/12/22 1130  TempSrc:   PainSc: 0-No pain                 Ondrea Dow,W. EDMOND

## 2022-09-12 NOTE — Anesthesia Procedure Notes (Signed)
Procedure Name: Intubation Date/Time: 09/12/2022 8:04 AM  Performed by: Maude Leriche, CRNAPre-anesthesia Checklist: Patient identified, Emergency Drugs available, Suction available and Patient being monitored Patient Re-evaluated:Patient Re-evaluated prior to induction Oxygen Delivery Method: Circle system utilized Preoxygenation: Pre-oxygenation with 100% oxygen Induction Type: IV induction Ventilation: Mask ventilation without difficulty Laryngoscope Size: Miller and 2 Grade View: Grade I Tube type: Oral Tube size: 7.5 mm Number of attempts: 1 Airway Equipment and Method: Stylet and Bite block Placement Confirmation: ETT inserted through vocal cords under direct vision, positive ETCO2 and breath sounds checked- equal and bilateral Secured at: 23 cm Tube secured with: Tape Dental Injury: Teeth and Oropharynx as per pre-operative assessment

## 2022-09-12 NOTE — Discharge Summary (Signed)
Physician Discharge Summary  Patient ID: Zachary [name] MRN: [id number] DOB/AGE: [birth year] 21 y.o.  Admit date: 09/12/2022 Discharge date: 09/12/2022  Admission Diagnoses:  Medically intractable epilepsy  Discharge Diagnoses:  Same Active Problems:   * No active hospital problems. *   Discharged Condition: Stable  Hospital Course:  Zachary [name] is a 21 y.o. male who underwent uncomplicated placement of a left [name]. He was at baseline postop and discharged home from PACU in stable condition.  Treatments: Surgery - Placement of left [name]  Discharge Exam: Blood pressure (!) 139/97, pulse 100, temperature 98 F (36.7 C), temperature source Oral, resp. rate 18, height 5\' 6"  (1.676 m), weight 81.2 kg, SpO2 99 %. Awake, alert, oriented Speech fluent, appropriate CN grossly intact 5/5 BUE/BLE Wound c/d/i  Disposition: Discharge disposition: 01-Home or Self Care       Discharge Instructions     Call MD for:  redness, tenderness, or signs of infection (pain, swelling, redness, odor or green/yellow discharge around incision site)   Complete by: As directed    Call MD for:  temperature >100.4   Complete by: As directed    Diet - low sodium heart healthy   Complete by: As directed    Discharge instructions   Complete by: As directed    Walk at home as much as possible, at least 4 times / day   Increase activity slowly   Complete by: As directed    Lifting restrictions   Complete by: As directed    No lifting > 10 lbs   May shower / Bathe   Complete by: As directed    48 hours after surgery   May walk up steps   Complete by: As directed    Other Restrictions   Complete by: As directed    No bending/twisting at waist   Remove dressing in 48 hours   Complete by: As directed       Allergies as of 09/12/2022       Reactions   Strawberry Extract Rash        Medication List     STOP taking these medications    Vitamin D (Ergocalciferol) 1.25 MG (50000 UNIT)  Caps capsule Commonly known as: DRISDOL       TAKE these medications    acetaminophen 500 MG tablet Commonly known as: TYLENOL Take 500 mg by mouth every 6 (six) hours as needed for mild pain.   amantadine 100 MG capsule Commonly known as: SYMMETREL TAKE 1 CAPSULE BY MOUTH TWICE A DAY   cloBAZam 10 MG tablet Commonly known as: ONFI Take 1 tablet (10 mg total) by mouth at bedtime.   lamoTRIgine 100 MG tablet Commonly known as: LAMICTAL 400 mg AM and 500 PM   levETIRAcetam 500 MG tablet Commonly known as: KEPPRA Take 2 tablets (1,000 mg total) by mouth 2 (two) times daily.   Melatonin 10 MG Caps Take 10 mg by mouth at bedtime.   multivitamin with minerals Tabs tablet Take 1 tablet by mouth daily.   oxyCODONE 5 MG immediate release tablet Commonly known as: Roxicodone Take 1 tablet (5 mg total) by mouth every 6 (six) hours as needed for up to 3 days for severe pain.   Valtoco 15 MG Dose 2 x 7.5 MG/0.1ML Lqpk Generic drug: diazePAM (15 MG Dose) Place 15 mg into the nose as needed (For seizure lasting more than 5 minutes).        Follow-up Information     11/12/2022,  MD Follow up in 2 week(s).   Specialty: Neurosurgery Contact information: [address] [place] [address] [phone]                 Signed: Jairo Ben 09/12/2022, 10:27 AM

## 2022-09-12 NOTE — Transfer of Care (Signed)
Immediate Anesthesia Transfer of Care Note  Patient: Zachary [name]  Procedure(s) Performed: VAGAL NERVE STIMULATOR IMPLANT (Left)  Patient Location: PACU  Anesthesia Type:General  Level of Consciousness: drowsy, patient cooperative and responds to stimulation  Airway & Oxygen Therapy: Patient Spontanous Breathing and Patient connected to nasal cannula oxygen  Post-op Assessment: Report given to RN, Post -op Vital signs reviewed and stable, Patient moving all extremities X 4 and Patient able to stick tongue midline  Post vital signs: Reviewed  Last Vitals:  Vitals Value Taken Time  BP 130/87 09/12/22 1038  Temp 36.9 C 09/12/22 1038  Pulse 104 09/12/22 1041  Resp 13 09/12/22 1041  SpO2 100 % 09/12/22 1041  Vitals shown include unvalidated device data.  Last Pain:  Vitals:   09/12/22 0631  TempSrc:   PainSc: 0-No pain         Complications: No notable events documented.

## 2022-09-12 NOTE — H&P (Signed)
Chief Complaint   Seizures  History of Present Illness  Zachary [name] is a 21 y.o. male with a history of medically intractable epilepsy since childhood.  Patient has been on multiple different antiepileptic medications with continued seizures and or significant side effects.  He therefore appeared to be a good candidate for vagus nerve stimulation.  Past Medical History   Past Medical History:  Diagnosis Date   ADHD (attention deficit hyperactivity disorder)    Autism    MDD (major depressive disorder)    Post traumatic stress disorder (PTSD)    Seasonal allergies    Seizures (HCC)    Social anxiety disorder of childhood     Past Surgical History   Past Surgical History:  Procedure Laterality Date   CIRCUMCISION  2002    Social History   Social History   Tobacco Use   Smoking status: Never   Smokeless tobacco: Current    Types: Snuff  Vaping Use   Vaping Use: Some days  Substance Use Topics   Alcohol use: No   Drug use: No    Medications   Prior to Admission medications   Medication Sig Start Date End Date Taking? Authorizing Provider  acetaminophen (TYLENOL) 500 MG tablet Take 500 mg by mouth every 6 (six) hours as needed for mild pain.   Yes [provider]  amantadine (SYMMETREL) 100 MG capsule TAKE 1 CAPSULE BY MOUTH TWICE A DAY 09/08/22  Yes Camara, Amadou, MD  cloBAZam (ONFI) 10 MG tablet Take 1 tablet (10 mg total) by mouth at bedtime. 05/23/22 11/19/22 Yes Alric Ran, MD  lamoTRIgine (LAMICTAL) 100 MG tablet 400 mg AM and 500 PM 05/23/22  Yes Camara, Amadou, MD  levETIRAcetam (KEPPRA) 500 MG tablet Take 2 tablets (1,000 mg total) by mouth 2 (two) times daily. 05/23/22  Yes Alric Ran, MD  Melatonin 10 MG CAPS Take 10 mg by mouth at bedtime.   Yes [provider]  Multiple Vitamin (MULTIVITAMIN WITH MINERALS) TABS tablet Take 1 tablet by mouth daily.   Yes [provider]  diazePAM, 15 MG Dose, (VALTOCO 15 MG DOSE) 2 x 7.5  MG/0.1ML LQPK Place 15 mg into the nose as needed (For seizure lasting more than 5 minutes). 07/13/22   Alric Ran, MD  Vitamin D, Ergocalciferol, (DRISDOL) 1.25 MG (50000 UNIT) CAPS capsule Take 1 capsule (50,000 Units total) by mouth every 7 (seven) days. Patient not taking: Reported on 09/02/2022 10/08/21   Alric Ran, MD    Allergies   Allergies  Allergen Reactions   Strawberry Extract Rash    Review of Systems  ROS  Neurologic Exam  Awake, alert, oriented Memory and concentration grossly intact Speech fluent, appropriate CN grossly intact Motor exam: Upper Extremities Deltoid Bicep Tricep Grip  Right 5/5 5/5 5/5 5/5  Left 5/5 5/5 5/5 5/5   Lower Extremities IP Quad PF DF EHL  Right 5/5 5/5 5/5 5/5 5/5  Left 5/5 5/5 5/5 5/5 5/5   Sensation grossly intact to LT  Impression  - 21 y.o. male with medically intractable epilepsy, appears to be a good candidate for vagus nerve stimulator therapy  Plan  -We will plan on proceeding with placement of a left [name]  I have reviewed the indications for the procedure as well as the details of the procedure and the expected postoperative course and recovery at length with the patient and family in the office. We have also reviewed in detail the risks, benefits, and alternatives to the procedure.  All questions were answered and Donzell Coller provided informed consent to proceed.  Lisbeth Renshaw, MD [facility]

## 2022-09-12 NOTE — Op Note (Signed)
  NEUROSURGERY OPERATIVE NOTE   PREOP DIAGNOSIS: Medically intractable epilepsy   POSTOP DIAGNOSIS: Same  PROCEDURE: 1. Placement of left Vagal nerve stimulator  SURGEON: Dr. Consuella Lose, MD  ANESTHESIA: General Endotracheal  EBL: Minimal  SPECIMENS: None  DRAINS: None  COMPLICATIONS: None immediate  CONDITION: Hemodynamically stable to PACU  HISTORY: Zachary [name] is a 21 y.o.  man who was initially seen in the outpatient clinic as a referral from neurology for medically intractable epilepsy. He continues to have seizures despite AEDs. The patient appeared to be a good candidate for the procedure. The risks and benefits of the surgery were reviewed in detail. After all questions were answered, informed consent was obtained.  PROCEDURE IN DETAIL: After informed consent was obtained and witnessed, the patient was brought to the operating room. After induction of general anesthesia, the patient was positioned on the operative table in the supine position. All pressure points were meticulously padded. Skin incisions were then marked out and prepped and draped in the usual sterile fashion.  After timeout was conducted, left transverse neck skin incision was infiltrated with local anesthetic with epinephrine. Skin incision was then made sharply, and Bovie electrocautery was used to dissect the subcutaneous tissue. The platysma muscle was identified and incised. The platysma was then undermined. The medial border of the sternocleidomastoid muscle was identified, and dissection was carried out utilizing natural tissue planes of the neck until the carotid sheath was identified. The jugular vein was initially identified and the carotid sheath was incised. The vagus nerve was then identified running between the carotid and jugular. The vagus nerve was circumferentially dissected for length of a proximally 2-1/2 cm. The infraclavicular incision was then infiltrated with local and opened  sharply, and Bovie electrocautery was used to dissect the subcutaneous tissue. A subcutaneous pocket was then made. A subcutaneous tunneler was then passed from the neck to the infraclavicular incision. The vagal stimulator leads were then tunneled. The leads were then placed on the vagus nerve. A relaxing curl was then placed, and the leaves were tacked to the sternocleidomastoid muscle. The generator was then connected to the leads, and placed in the pocket.  Testing was done to confirm normal impedance, and good placement and heart rate detection.   Wounds were then irrigated with copious amounts of normal saline irrigation. Relaxing curl was placed on the neck leads and tacked to the SCM. The platysma was closed using interrupted 3-0 Vicryl stitches. Subcutaneous layer and the subclavicular incision was closed using interrupted 3-0 Vicryl stitches. Skin was then closed using interrupted 3-0 Vicryl, and a layer of Dermabond was applied.   At the end of the case all sponge, needle, cottonoid, and instrument counts were correct. The patient was then extubated, transferred to the stretcher, and taken to the postanesthesia care unit in stable hemodynamic condition.   Consuella Lose, MD [facility]

## 2022-09-13 ENCOUNTER — Encounter (HOSPITAL_COMMUNITY): Payer: Self-pay | Admitting: Neurosurgery

## 2022-09-22 ENCOUNTER — Telehealth: Payer: Self-pay | Admitting: Neurology

## 2022-09-22 NOTE — Telephone Encounter (Signed)
I spoke with the pharmacy. The patient has refills on file. They will be able to fill the prescription after 2 PM today. I called the patient's caregiver, Elsie Saas (as per [facility]) and informed her. She verbalized understanding and expressed appreciation for the call.

## 2022-09-22 NOTE — Telephone Encounter (Signed)
Pt is requesting a refill for amantadine (SYMMETREL) 100 MG capsule.  Pharmacy: [id number]

## 2022-10-03 ENCOUNTER — Ambulatory Visit (INDEPENDENT_AMBULATORY_CARE_PROVIDER_SITE_OTHER): Payer: Medicaid Other | Admitting: Neurology

## 2022-10-03 ENCOUNTER — Encounter: Payer: Self-pay | Admitting: Neurology

## 2022-10-03 VITALS — BP 143/81 | HR 83 | Ht 66.0 in | Wt 179.0 lb

## 2022-10-03 DIAGNOSIS — F70 Mild intellectual disabilities: Secondary | ICD-10-CM

## 2022-10-03 DIAGNOSIS — G40319 Generalized idiopathic epilepsy and epileptic syndromes, intractable, without status epilepticus: Secondary | ICD-10-CM | POA: Diagnosis not present

## 2022-10-03 NOTE — Progress Notes (Signed)
[facility]  PATIENT: Zachary [name] DOB: 2001-07-25  REFERRING CLINICIAN: Corrington, Kip A, MD HISTORY FROM: Patient and Grandmother  REASON FOR VISIT: Seizures    HISTORICAL  CHIEF COMPLAINT:  Chief Complaint  Patient presents with   Follow-up    Rm 15. Accompanied by grandmother. No new seizures with [name].   INTERVAL HISTORY 10/03/22:  Patient presents today for follow-up, he is accompanied by grandmother.  He has a [name] placed on October 2.  Since then he has not had any seizure or seizure-like activity.  Overall doing well.  Tolerated the procedure very well.   INTERVAL HISTORY 07/12/22:  Zachary [name] presents today with grandmother. At last visit, plan was to start Cenobamate, after taking the medication for about  week, he has worsening behavior, increase aggression and the medication was discontinue and his behavior is back to normal. He denies any additional seizure since last visit. Currently he is on Keppra, Lamotrigine and Clobazam.  He has not completed the EEG yet.    INTERVAL HISTORY 05/23/2022:  Zachary [name] presents today for follow-up.  He is accompanied by his grandmother.  Last visit was in October, at that time I started him on clobazam.  Grandmother reports since starting the clobazam he has been doing well actually, was seizure-free up for about 3 months after that.  But seizure return at 2 months ago during this time also Zachary [name] was noted to start using kratom. Grandmother was not aware of it until later when Zachary [name] told him about the Kratom.  He reported that yesterday he had 2 strong generalized convulsions back-to-back each lasting about 3 minutes.  She did not take him to the emergency department.  Zachary [name] reported he has not taken the kratom for about 2 weeks.  He is compliant with his current medications.  He continues to work.  Currently he is on clobazam 10 mg nightly lamotrigine 400 mg in the morning and 500 mg at night levetiracetam 1000 mg twice  daily.   HISTORY OF PRESENT ILLNESS:  This is a 21 year old gentleman with past medical history of generalized seizure, autism spectrum disorder and depression who is presenting for management of his epilepsy.  Patient was previously under the care of Dr. Sharene Skeans for his seizure disorder.  Per chart review he has his first seizure in 2012 described as generalized tonic-clonic, at that time he had a EEG that showed high amplitude generalized spikes, polyspike or slow wave discharges that occur singly at 1 to 2 seconds of cluster.  He did have a second seizure in March 2013 while traveling in car with his father there was flickering of some light to the trees.  He began to blink, stare and then has a generalized tonic-clonic seizure.  At that time he had a head CT which was normal.  He was initially started on Depakote but did not tolerate due to agitation. Currently he is on Keppra 1000 mg twice daily and lamotrigine 400 mg in the morning and 500 mg at night.  He is also on Klonopin 1.25 mg at bedtime.  Grandmother reports that the last seizure occurred 48-month ago while he was attending a car race.  He reported there was light on the track but the cars themselves did not have any headlights.  Prior to that his previous seizure was a couple weeks before.  They do not have a rescue medication.   Handedness: Right handed   Seizure Type: Focal seizures and generalized seizures  Current frequency: Last seizure  2 months   Any injuries from seizures: None   Seizure risk factors: Autism,   Previous ASMs: Depakote, Klonopin  Currenty ASMs: Lamotrigine 400 mg AM and 500 mg PM, Levetiracetam 1000 mg BID and Clobazam 10 mg at bedtime   ASMs side effects: None   Brain Images: normal Heat CT   Previous EEGs: High amplitude generalized spike polyspike and slow wave   OTHER MEDICAL CONDITIONS: Generalized epilepsy, ADHD, autism, depression  REVIEW OF SYSTEMS: Full 14 system review of systems  performed and negative with exception of: As noted in the HPI  ALLERGIES: Allergies  Allergen Reactions   Strawberry Extract Rash    HOME MEDICATIONS: Outpatient Medications Prior to Visit  Medication Sig Dispense Refill   acetaminophen (TYLENOL) 500 MG tablet Take 500 mg by mouth every 6 (six) hours as needed for mild pain.     amantadine (SYMMETREL) 100 MG capsule TAKE 1 CAPSULE BY MOUTH TWICE A DAY 60 capsule 2   cloBAZam (ONFI) 10 MG tablet Take 1 tablet (10 mg total) by mouth at bedtime. 30 tablet 5   diazePAM, 15 MG Dose, (VALTOCO 15 MG DOSE) 2 x 7.5 MG/0.1ML LQPK Place 15 mg into the nose as needed (For seizure lasting more than 5 minutes). 2 each 5   lamoTRIgine (LAMICTAL) 100 MG tablet 400 mg AM and 500 PM 270 tablet 5   levETIRAcetam (KEPPRA) 500 MG tablet Take 2 tablets (1,000 mg total) by mouth 2 (two) times daily. 360 tablet 1   Melatonin 10 MG CAPS Take 10 mg by mouth at bedtime.     Multiple Vitamin (MULTIVITAMIN WITH MINERALS) TABS tablet Take 1 tablet by mouth daily.     No facility-administered medications prior to visit.    PAST MEDICAL HISTORY: Past Medical History:  Diagnosis Date   ADHD (attention deficit hyperactivity disorder)    Autism    MDD (major depressive disorder)    Post traumatic stress disorder (PTSD)    Seasonal allergies    Seizures (HCC)    Social anxiety disorder of childhood     PAST SURGICAL HISTORY: Past Surgical History:  Procedure Laterality Date   CIRCUMCISION  2002   VAGUS NERVE STIMULATOR INSERTION Left 09/12/2022   Procedure: VAGAL NERVE STIMULATOR IMPLANT;  Surgeon: Lisbeth Renshaw, MD;  Location: [facility] OR;  Service: Neurosurgery;  Laterality: Left;    FAMILY HISTORY: Family History  Problem Relation Age of Onset   Other Mother        Slow Learner   Seizures Mother        Had Seizures Due to Head Trauma   Other Father        Slow Learner   Asthma Sister    Seizures Maternal Grandmother        Generalized Seizures    Heart attack Maternal Grandfather        Died at 72    SOCIAL HISTORY: Social History   Socioeconomic History   Marital status: Single    Spouse name: Not on file   Number of children: Not on file   Years of education: Not on file   Highest education level: Not on file  Occupational History   Not on file  Tobacco Use   Smoking status: Never   Smokeless tobacco: Current    Types: Snuff  Vaping Use   Vaping Use: Some days  Substance and Sexual Activity   Alcohol use: No   Drug use: No   Sexual activity: Never  Other Topics  Concern   Not on file  Social History Narrative   Zachary [name] is no longer in school.   He lives with his grandmother.   He enjoys working with his hands, mowing, weed eating, drawing, and playing on his phone.   Social Determinants of Health   Financial Resource Strain: Not on file  Food Insecurity: Not on file  Transportation Needs: Not on file  Physical Activity: Not on file  Stress: Not on file  Social Connections: Not on file  Intimate Partner Violence: Not on file     PHYSICAL EXAM  GENERAL EXAM/CONSTITUTIONAL: Vitals:  Vitals:   10/03/22 1537  BP: (!) 143/81  Pulse: 83  Weight: 179 lb (81.2 kg)  Height: 5\' 6"  (1.676 m)    Body mass index is 28.89 kg/m. Wt Readings from Last 3 Encounters:  10/03/22 179 lb (81.2 kg)  09/12/22 179 lb (81.2 kg)  09/07/22 182 lb 6.4 oz (82.7 kg)   Patient is in no distress; well developed, nourished and groomed; neck is supple  EYES: Pupils round and reactive to light, Visual fields full to confrontation, Extraocular movements intacts,  No results found.  MUSCULOSKELETAL: Gait, strength, tone, movements noted in Neurologic exam below  NEUROLOGIC: MENTAL STATUS:      No data to display         awake, alert, oriented to person, place and time recent and remote memory intact normal attention and concentration language fluent, comprehension intact, naming intact   CRANIAL NERVE:  2nd,  3rd, 4th, 6th - pupils equal and reactive to light, visual fields full to confrontation, extraocular muscles intact, no nystagmus 5th - facial sensation symmetric 7th - facial strength symmetric 8th - hearing intact 9th - palate elevates symmetrically, uvula midline 11th - shoulder shrug symmetric 12th - tongue protrusion midline  MOTOR:  normal bulk and tone, full strength in the BUE, BLE  SENSORY:  normal and symmetric to light touch, pinprick, temperature, vibration  COORDINATION:  finger-nose-finger, fine finger movements normal  REFLEXES:  deep tendon reflexes present and symmetric  GAIT/STATION:  normal   DIAGNOSTIC DATA (LABS, IMAGING, TESTING) - I reviewed patient records, labs, notes, testing and imaging myself where available.  Lab Results  Component Value Date   WBC 6.2 09/07/2022   HGB 15.9 09/07/2022   HCT 44.8 09/07/2022   MCV 87.5 09/07/2022   PLT 246 09/07/2022      Component Value Date/Time   NA 140 09/07/2022 1600   NA 141 10/05/2021 1511   K 3.9 09/07/2022 1600   CL 106 09/07/2022 1600   CO2 29 09/07/2022 1600   GLUCOSE 92 09/07/2022 1600   BUN 9 09/07/2022 1600   BUN 5 (L) 10/05/2021 1511   CREATININE 1.13 09/07/2022 1600   CALCIUM 9.3 09/07/2022 1600   PROT 8.1 08/10/2021 0133   ALBUMIN 4.7 08/10/2021 0133   AST 24 08/10/2021 0133   ALT 31 08/10/2021 0133   ALKPHOS 98 08/10/2021 0133   BILITOT 0.9 08/10/2021 0133   GFRNONAA >60 09/07/2022 1600   GFRAA >60 06/22/2019 1431   No results found for: "CHOL", "HDL", "LDLCALC", "LDLDIRECT", "TRIG" No results found for: "HGBA1C" No results found for: "VITAMINB12" Lab Results  Component Value Date   TSH 3.194 12/29/2010    Head CT 2012 Normal head CT   EEG 2015 This is a abnormal record with the patient awake.  Is characterized by generalized spike and slow-wave activity and bilateral occipital spike and slow-wave discharges that are epileptogenic from an  electrographic viewpoint.  This  correlates with a primary generalized epilepsy that may be photic sensitive.   ASSESSMENT AND PLAN  21 y.o. year old male  with past medical history of generalized epilepsy, mild intellectual disability, autism spectrum disorder and depression here for follow up.  He is on 3 AED including lamotrigine 400 mg in the morning and 500 mg at night, levetiracetam 1000 mg twice daily and clobazam 10 mg nightly.  He is status post [name] therapy on October 2, tolerated the procedure very well, grandmother reports no seizure since [name] implantation.  [name] turned on today.  Again tolerated very well.  We will continue current antiseizure medications, and I will see him in 4 weeks at that time we will make additional [name] changes.   1. 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 Mild intellectual disability      PLAN: Continue with current medications   Keppra 1000 mg twice a day   Lamictal 400 mg in the morning and 500 mg at night   Clobazam 10 mg nightly  Valtoco as rescue medication  [name] turned on today, tolerated procedure well.  Follow up in 4 weeks, at that time, will do additional [name] setting changes    [place] [facility] [place] [place], patients with seizures are not allowed to drive until they have been seizure-free for six months.  Other recommendations include using caution when using heavy equipment or power tools. Avoid working on ladders or at heights. Take showers instead of baths.  Do not swim alone.  Ensure the water temperature is not too high on the home water heater. Do not go swimming alone. Do not lock yourself in a room alone (i.e. bathroom). When caring for infants or small children, sit down when holding, feeding, or changing them to minimize risk of injury to the child in the event you have a seizure. Maintain good sleep hygiene. Avoid alcohol.  Also recommend adequate sleep, hydration, good diet and minimize stress.   During the Seizure  -  First, ensure adequate ventilation and place patients on the floor on their left side  Loosen clothing around the neck and ensure the airway is patent. If the patient is clenching the teeth, do not force the mouth open with any object as this can cause severe damage - Remove all items from the surrounding that can be hazardous. The patient may be oblivious to what's happening and may not even know what he or she is doing. If the patient is confused and wandering, either gently guide him/her away and block access to outside areas - Reassure the individual and be comforting - Call 911. In most cases, the seizure ends before EMS arrives. However, there are cases when seizures may last over 3 to 5 minutes. Or the individual may have developed breathing difficulties or severe injuries. If a pregnant patient or a person with diabetes develops a seizure, it is prudent to call an ambulance. - Finally, if the patient does not regain full consciousness, then call EMS. Most patients will remain confused for about 45 to 90 minutes after a seizure, so you must use judgment in calling for help. - Avoid restraints but make sure the patient is in a bed with padded side rails - Place the individual in a lateral position with the neck slightly flexed; this will help the saliva drain from the mouth and prevent the tongue from falling backward - Remove all nearby furniture and other hazards from the  area - Provide verbal assurance as the individual is regaining consciousness - Provide the patient with privacy if possible - Call for help and start treatment as ordered by the caregiver   After the Seizure (Postictal Stage)  After a seizure, most patients experience confusion, fatigue, muscle pain and/or a headache. Thus, one should permit the individual to sleep. For the next few days, reassurance is essential. Being calm and helping reorient the person is also of importance.  Most seizures are painless and end  spontaneously. Seizures are not harmful to others but can lead to complications such as stress on the lungs, brain and the heart. Individuals with prior lung problems may develop labored breathing and respiratory distress.     Orders Placed This Encounter  Procedures   Vagal Nerve Stimulation    No orders of the defined types were placed in this encounter.   Return in about 4 weeks (around 10/31/2022).     Alric Ran, MD 10/03/2022, 4:09 PM  [facility] [address] [place], [place] [address] [phone]

## 2022-10-03 NOTE — Patient Instructions (Addendum)
Continue with current medications   Keppra 1000 mg twice a day   Lamictal 400 mg in the morning and 500 mg at night   Clobazam 10 mg nightly  Valtoco as rescue medication  [name] turned on today, tolerated procedure well.  Follow up in 4 weeks, at that time, will do additional [name] setting changes

## 2022-10-04 ENCOUNTER — Ambulatory Visit: Payer: Medicaid Other | Admitting: Neurology

## 2022-10-20 ENCOUNTER — Ambulatory Visit: Payer: Medicaid Other | Admitting: Neurology

## 2022-10-25 ENCOUNTER — Ambulatory Visit: Payer: Medicaid Other | Admitting: Neurology

## 2022-11-01 ENCOUNTER — Ambulatory Visit: Payer: Medicaid Other | Admitting: Neurology

## 2022-11-08 ENCOUNTER — Telehealth: Payer: Self-pay | Admitting: Neurology

## 2022-11-08 NOTE — Telephone Encounter (Signed)
That is fine. Have them come at 1145. Thanks

## 2022-11-08 NOTE — Telephone Encounter (Signed)
Pt's mother accepted 11/29 at 11:45am. She appreciated working him in.

## 2022-11-08 NOTE — Telephone Encounter (Signed)
Pt's grandmother, [name] (on [place]), [name] needs to be turned up. Pt missed last appt to turn [name]. Pt is depressed from friend passing away. Asking if can be worked in to turn up [name]. Please call this number [phone]

## 2022-11-09 ENCOUNTER — Encounter: Payer: Self-pay | Admitting: Neurology

## 2022-11-09 ENCOUNTER — Ambulatory Visit: Payer: Medicaid Other | Admitting: Neurology

## 2022-11-09 DIAGNOSIS — G40309 Generalized idiopathic epilepsy and epileptic syndromes, not intractable, without status epilepticus: Secondary | ICD-10-CM

## 2022-11-09 DIAGNOSIS — G40219 Localization-related (focal) (partial) symptomatic epilepsy and epileptic syndromes with complex partial seizures, intractable, without status epilepticus: Secondary | ICD-10-CM | POA: Diagnosis not present

## 2022-11-09 MED ORDER — LAMOTRIGINE 100 MG PO TABS: ORAL_TABLET | ORAL | 3 refills | Status: DC

## 2022-11-09 MED ORDER — LEVETIRACETAM 1000 MG PO TABS: 1000.0000 mg | ORAL_TABLET | Freq: Two times a day (BID) | ORAL | 3 refills | Status: DC

## 2022-11-09 MED ORDER — CLOBAZAM 10 MG PO TABS: 10.0000 mg | ORAL_TABLET | Freq: Every evening | ORAL | 5 refills | Status: DC

## 2022-11-09 NOTE — Progress Notes (Signed)
[facility]  PATIENT: Zachary [name] DOB: 10/11/2001  REFERRING CLINICIAN: Corrington, Kip A, MD HISTORY FROM: Patient and Grandmother  REASON FOR VISIT: Seizures    HISTORICAL  CHIEF COMPLAINT:  Chief Complaint  Patient presents with   Follow-up    Rm 13 with grandmother and father. Reports a death within the family and has been depressed since but reports doing better today    INTERVAL HISTORY 11/09/22:  Zachary [name] presents today for follow-up, he is accompanied by both grandmother and father.  Last visit was on October 23, since then he has not had any seizures.  He is tolerating the [name] very well.  Denies any excessive coughing, denies any hoarseness of voice.  Overall no complaints doing well.   INTERVAL HISTORY 10/03/22:  Patient presents today for follow-up, he is accompanied by grandmother.  He has a [name] placed on October 2.  Since then he has not had any seizure or seizure-like activity.  Overall doing well.  Tolerated the procedure very well.   INTERVAL HISTORY 07/12/22:  [name] presents today with grandmother. At last visit, plan was to start Cenobamate, after taking the medication for about  week, he has worsening behavior, increase aggression and the medication was discontinue and his behavior is back to normal. He denies any additional seizure since last visit. Currently he is on Keppra, Lamotrigine and Clobazam.  He has not completed the EEG yet.    INTERVAL HISTORY 05/23/2022:  Zachary [name] presents today for follow-up.  He is accompanied by his grandmother.  Last visit was in October, at that time I started him on clobazam.  Grandmother reports since starting the clobazam he has been doing well actually, was seizure-free up for about 3 months after that.  But seizure return at 2 months ago during this time also Marquin was noted to start using kratom. Grandmother was not aware of it until later when Therman told him about the Kratom.  He reported that yesterday  he had 2 strong generalized convulsions back-to-back each lasting about 3 minutes.  She did not take him to the emergency department.  Brodin reported he has not taken the kratom for about 2 weeks.  He is compliant with his current medications.  He continues to work.  Currently he is on clobazam 10 mg nightly lamotrigine 400 mg in the morning and 500 mg at night levetiracetam 1000 mg twice daily.   HISTORY OF PRESENT ILLNESS:  This is a 21 year old gentleman with past medical history of generalized seizure, autism spectrum disorder and depression who is presenting for management of his epilepsy.  Patient was previously under the care of Dr. Sharene Skeans for his seizure disorder.  Per chart review he has his first seizure in 2012 described as generalized tonic-clonic, at that time he had a EEG that showed high amplitude generalized spikes, polyspike or slow wave discharges that occur singly at 1 to 2 seconds of cluster.  He did have a second seizure in March 2013 while traveling in car with his father there was flickering of some light to the trees.  He began to blink, stare and then has a generalized tonic-clonic seizure.  At that time he had a head CT which was normal.  He was initially started on Depakote but did not tolerate due to agitation. Currently he is on Keppra 1000 mg twice daily and lamotrigine 400 mg in the morning and 500 mg at night.  He is also on Klonopin 1.25 mg at bedtime.  Grandmother reports  that the last seizure occurred 34-month ago while he was attending a car race.  He reported there was light on the track but the cars themselves did not have any headlights.  Prior to that his previous seizure was a couple weeks before.  They do not have a rescue medication.   Handedness: Right handed   Seizure Type: Focal seizures and generalized seizures  Current frequency: Last seizure 2 months   Any injuries from seizures: None   Seizure risk factors: Autism,   Previous ASMs: Depakote,  Klonopin  Currenty ASMs: Lamotrigine 400 mg AM and 500 mg PM, Levetiracetam 1000 mg BID and Clobazam 10 mg at bedtime   ASMs side effects: None   Brain Images: normal Heat CT   Previous EEGs: High amplitude generalized spike polyspike and slow wave   OTHER MEDICAL CONDITIONS: Generalized epilepsy, ADHD, autism, depression  REVIEW OF SYSTEMS: Full 14 system review of systems performed and negative with exception of: As noted in the HPI  ALLERGIES: Allergies  Allergen Reactions   Strawberry Extract Rash    HOME MEDICATIONS: Outpatient Medications Prior to Visit  Medication Sig Dispense Refill   acetaminophen (TYLENOL) 500 MG tablet Take 500 mg by mouth every 6 (six) hours as needed for mild pain.     amantadine (SYMMETREL) 100 MG capsule TAKE 1 CAPSULE BY MOUTH TWICE A DAY 60 capsule 2   diazePAM, 15 MG Dose, (VALTOCO 15 MG DOSE) 2 x 7.5 MG/0.1ML LQPK Place 15 mg into the nose as needed (For seizure lasting more than 5 minutes). 2 each 5   Melatonin 10 MG CAPS Take 10 mg by mouth at bedtime.     Multiple Vitamin (MULTIVITAMIN WITH MINERALS) TABS tablet Take 1 tablet by mouth daily.     cloBAZam (ONFI) 10 MG tablet Take 1 tablet (10 mg total) by mouth at bedtime. 30 tablet 5   lamoTRIgine (LAMICTAL) 100 MG tablet 400 mg AM and 500 PM 270 tablet 5   levETIRAcetam (KEPPRA) 500 MG tablet Take 2 tablets (1,000 mg total) by mouth 2 (two) times daily. 360 tablet 1   No facility-administered medications prior to visit.    PAST MEDICAL HISTORY: Past Medical History:  Diagnosis Date   ADHD (attention deficit hyperactivity disorder)    Autism    MDD (major depressive disorder)    Post traumatic stress disorder (PTSD)    Seasonal allergies    Seizures (HCC)    Social anxiety disorder of childhood     PAST SURGICAL HISTORY: Past Surgical History:  Procedure Laterality Date   CIRCUMCISION  2002   VAGUS NERVE STIMULATOR INSERTION Left 09/12/2022   Procedure: VAGAL NERVE STIMULATOR  IMPLANT;  Surgeon: Lisbeth Renshaw, MD;  Location: [facility] OR;  Service: Neurosurgery;  Laterality: Left;    FAMILY HISTORY: Family History  Problem Relation Age of Onset   Other Mother        Slow Learner   Seizures Mother        Had Seizures Due to Head Trauma   Other Father        Slow Learner   Asthma Sister    Seizures Maternal Grandmother        Generalized Seizures   Heart attack Maternal Grandfather        Died at 31    SOCIAL HISTORY: Social History   Socioeconomic History   Marital status: Single    Spouse name: Not on file   Number of children: Not on file   Years  of education: Not on file   Highest education level: Not on file  Occupational History   Not on file  Tobacco Use   Smoking status: Never   Smokeless tobacco: Current    Types: Snuff  Vaping Use   Vaping Use: Some days  Substance and Sexual Activity   Alcohol use: No   Drug use: No   Sexual activity: Never  Other Topics Concern   Not on file  Social History Narrative   Dawayne is no longer in school.   He lives with his grandmother.   He enjoys working with his hands, mowing, weed eating, drawing, and playing on his phone.   Social Determinants of Health   Financial Resource Strain: Not on file  Food Insecurity: Not on file  Transportation Needs: Not on file  Physical Activity: Not on file  Stress: Not on file  Social Connections: Not on file  Intimate Partner Violence: Not on file     PHYSICAL EXAM  GENERAL EXAM/CONSTITUTIONAL: Vitals:  Vitals:   11/09/22 1231  BP: 138/86  Pulse: 98  Weight: 186 lb (84.4 kg)  Height: 5\' 10"  (1.778 m)    Body mass index is 26.69 kg/m. Wt Readings from Last 3 Encounters:  11/09/22 186 lb (84.4 kg)  10/03/22 179 lb (81.2 kg)  09/12/22 179 lb (81.2 kg)   Patient is in no distress; well developed, nourished and groomed; neck is supple  EYES: Visual fields full to confrontation, Extraocular movements intacts,  No results  found.  MUSCULOSKELETAL: Gait, strength, tone, movements noted in Neurologic exam below  NEUROLOGIC: MENTAL STATUS:      No data to display         awake, alert, oriented to person, place and time recent and remote memory intact normal attention and concentration language fluent, comprehension intact, naming intact   CRANIAL NERVE:  2nd, 3rd, 4th, 6th -visual fields full to confrontation, extraocular muscles intact, no nystagmus 5th - facial sensation symmetric 7th - facial strength symmetric 8th - hearing intact 9th - palate elevates symmetrically, uvula midline 11th - shoulder shrug symmetric 12th - tongue protrusion midline  MOTOR:  normal bulk and tone, full strength in the BUE, BLE  SENSORY:  normal and symmetric to light touch, pinprick, temperature, vibration  GAIT/STATION:  normal   DIAGNOSTIC DATA (LABS, IMAGING, TESTING) - I reviewed patient records, labs, notes, testing and imaging myself where available.  Lab Results  Component Value Date   WBC 6.2 09/07/2022   HGB 15.9 09/07/2022   HCT 44.8 09/07/2022   MCV 87.5 09/07/2022   PLT 246 09/07/2022      Component Value Date/Time   NA 140 09/07/2022 1600   NA 141 10/05/2021 1511   K 3.9 09/07/2022 1600   CL 106 09/07/2022 1600   CO2 29 09/07/2022 1600   GLUCOSE 92 09/07/2022 1600   BUN 9 09/07/2022 1600   BUN 5 (L) 10/05/2021 1511   CREATININE 1.13 09/07/2022 1600   CALCIUM 9.3 09/07/2022 1600   PROT 8.1 08/10/2021 0133   ALBUMIN 4.7 08/10/2021 0133   AST 24 08/10/2021 0133   ALT 31 08/10/2021 0133   ALKPHOS 98 08/10/2021 0133   BILITOT 0.9 08/10/2021 0133   GFRNONAA >60 09/07/2022 1600   GFRAA >60 06/22/2019 1431   No results found for: "CHOL", "HDL", "LDLCALC", "LDLDIRECT", "TRIG" No results found for: "HGBA1C" No results found for: "VITAMINB12" Lab Results  Component Value Date   TSH 3.194 12/29/2010    Head CT  2012 Normal head CT   EEG 2015 This is a abnormal record with  the patient awake.  Is characterized by generalized spike and slow-wave activity and bilateral occipital spike and slow-wave discharges that are epileptogenic from an electrographic viewpoint.  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   ASSESSMENT AND PLAN  21 y.o. year old male  with past medical history of generalized epilepsy, mild intellectual disability, autism spectrum disorder and depression here for follow up.  He is on 3 AED including lamotrigine 400 mg in the morning and 500 mg at night, levetiracetam 1000 mg twice daily and clobazam 10 mg nightly.  He is status post [name] therapy on October 2, tolerated the procedure very well, grandmother reports no seizure since [name] implantation, overall doing very well.  [name] setting changed today, 3 changes. He tolerated the procedure well. I will see him in 6 weeks for additional changes.    1. Generalized convulsive epilepsy (HCC)   2. Partial symptomatic epilepsy with complex partial seizures, intractable, without status epilepticus (HCC)       PLAN: Continue with current medications   Keppra 1000 mg twice a day   Lamictal 400 mg in the morning and 500 mg at night   Clobazam 10 mg nightly  Valtoco as rescue medication  [name] setting changes today. Tolerated procedure well  Follow up in 6 weeks for additional [name] setting changes.    [place], patients with seizures are not allowed to drive until they have been seizure-free for six months.  Other recommendations include using caution when using heavy equipment or power tools. Avoid working on ladders or at heights. Take showers instead of baths.  Do not swim alone.  Ensure the water temperature is not too high on the home water heater. Do not go swimming alone. Do not lock yourself in a room alone (i.e. bathroom). When caring for infants or small children, sit down when holding, feeding, or changing them to minimize risk of injury to  the child in the event you have a seizure. Maintain good sleep hygiene. Avoid alcohol.  Also recommend adequate sleep, hydration, good diet and minimize stress.   During the Seizure  - First, ensure adequate ventilation and place patients on the floor on their left side  Loosen clothing around the neck and ensure the airway is patent. If the patient is clenching the teeth, do not force the mouth open with any object as this can cause severe damage - Remove all items from the surrounding that can be hazardous. The patient may be oblivious to what's happening and may not even know what he or she is doing. If the patient is confused and wandering, either gently guide him/her away and block access to outside areas - Reassure the individual and be comforting - Call 911. In most cases, the seizure ends before EMS arrives. However, there are cases when seizures may last over 3 to 5 minutes. Or the individual may have developed breathing difficulties or severe injuries. If a pregnant patient or a person with diabetes develops a seizure, it is prudent to call an ambulance. - Finally, if the patient does not regain full consciousness, then call EMS. Most patients will remain confused for about 45 to 90 minutes after a seizure, so you must use judgment in calling for help. - Avoid restraints but make sure the patient is in a bed with padded side rails - Place the individual in a  lateral position with the neck slightly flexed; this will help the saliva drain from the mouth and prevent the tongue from falling backward - Remove all nearby furniture and other hazards from the area - Provide verbal assurance as the individual is regaining consciousness - Provide the patient with privacy if possible - Call for help and start treatment as ordered by the caregiver   After the Seizure (Postictal Stage)  After a seizure, most patients experience confusion, fatigue, muscle pain and/or a headache. Thus, one should  permit the individual to sleep. For the next few days, reassurance is essential. Being calm and helping reorient the person is also of importance.  Most seizures are painless and end spontaneously. Seizures are not harmful to others but can lead to complications such as stress on the lungs, brain and the heart. Individuals with prior lung problems may develop labored breathing and respiratory distress.     Orders Placed This Encounter  Procedures   Vagal Nerve Stimulation    Meds ordered this encounter  Medications   cloBAZam (ONFI) 10 MG tablet    Sig: Take 1 tablet (10 mg total) by mouth at bedtime.    Dispense:  90 tablet    Refill:  5    This request is for a new prescription for a controlled substance as required by Federal/State law..   lamoTRIgine (LAMICTAL) 100 MG tablet    Sig: 400 mg AM and 500 PM    Dispense:  810 tablet    Refill:  3    Please provide 90 days supply   levETIRAcetam (KEPPRA) 1000 MG tablet    Sig: Take 1 tablet (1,000 mg total) by mouth 2 (two) times daily.    Dispense:  360 tablet    Refill:  3    Please give 90 days supply    Return in about 6 weeks (around 12/21/2022).     Windell Norfolk, MD 11/09/2022, 1:21 PM  [facility] [address] [place], [place] [address] [phone]

## 2022-11-09 NOTE — Patient Instructions (Signed)
Continue current antiseizure medications: lamotrigine, levetiracetam and clobazam.  Refill given [name] settings changed today Follow-up in 6 weeks for additional [name] settings changes Return sooner if worse.

## 2022-12-21 ENCOUNTER — Ambulatory Visit: Payer: Medicaid Other | Admitting: Neurology

## 2023-01-30 ENCOUNTER — Telehealth: Payer: Self-pay | Admitting: Neurology

## 2023-01-30 NOTE — Telephone Encounter (Signed)
Pt grandmother [name] is calling. Stated she think pt [name] needs to be turned up. Stated he had a major seizure in the [name], stated he cut his mouth in two places. Stated pt had two seizures back to back and she had to call EMS because pt turned blue. Stated she swipe across the chest several times and it didn't work. She is requesting a sooner appointment for pt.Zachary [name]

## 2023-01-30 NOTE — Telephone Encounter (Signed)
I was supposed to see them on 1/10 but they cancelled. Please add them on my schedule on 2/22. Seems like I have an opening.   Thanks

## 2023-02-02 ENCOUNTER — Ambulatory Visit (INDEPENDENT_AMBULATORY_CARE_PROVIDER_SITE_OTHER): Payer: Medicaid Other | Admitting: Neurology

## 2023-02-02 ENCOUNTER — Encounter: Payer: Self-pay | Admitting: Neurology

## 2023-02-02 VITALS — BP 131/85 | HR 81 | Ht 70.0 in | Wt 178.0 lb

## 2023-02-02 DIAGNOSIS — G40319 Generalized idiopathic epilepsy and epileptic syndromes, intractable, without status epilepticus: Secondary | ICD-10-CM | POA: Diagnosis not present

## 2023-02-02 DIAGNOSIS — Z9689 Presence of other specified functional implants: Secondary | ICD-10-CM

## 2023-02-02 NOTE — Progress Notes (Signed)
[facility]  PATIENT: Zachary [name] DOB: October 28, 2001  REFERRING CLINICIAN: Corrington, Kip A, MD HISTORY FROM: Patient and Grandmother  REASON FOR VISIT: Seizures    HISTORICAL  CHIEF COMPLAINT:  Chief Complaint  Patient presents with   Follow-up    Rm 12. Accompanied by grandmother, [name]. Patient had first seizure since [name] implant two weeks ago, he had a back to back seizure. He took lamotrigine in the evening and keppra at night, in reverse order to his usual routine, which he believes triggered the seizure.   INTERVAL HISTORY 02/02/2023 Zachary [name] presents today for follow-up.  He is accompanied by grandmother.  Last visit was in November 29.  He missed a couple appointments.  He reported having a breakthrough seizure 2 weeks ago.  Grandmother reports the seizure being a generalized tonic-clonic seizure, she did not use the Valtoco as rescue medication.  He might have made mistake regarding his antiseizure medications, Keppra and the lamotrigine but unsure which mistake he made because both of his medication are twice a day and he felt like he took his medications twice that day.  Unclear but he still had a seizure. Since then he has not had any additional seizures.  [name] was turned on at last visit, he has tolerated well.    INTERVAL HISTORY 11/09/22:  Zachary [name] presents today for follow-up, he is accompanied by both grandmother and father.  Last visit was on October 23, since then he has not had any seizures.  He is tolerating the [name] very well.  Denies any excessive coughing, denies any hoarseness of voice.  Overall no complaints doing well.   INTERVAL HISTORY 10/03/22:  Patient presents today for follow-up, he is accompanied by grandmother.  He has a [name] placed on October 2.  Since then he has not had any seizure or seizure-like activity.  Overall doing well.  Tolerated the procedure very well.   INTERVAL HISTORY 07/12/22:  Zachary [name] presents today with grandmother.  At last visit, plan was to start Cenobamate, after taking the medication for about  week, he has worsening behavior, increase aggression and the medication was discontinue and his behavior is back to normal. He denies any additional seizure since last visit. Currently he is on Keppra, Lamotrigine and Clobazam.  He has not completed the EEG yet.    INTERVAL HISTORY 05/23/2022:  Zachary [name] presents today for follow-up.  He is accompanied by his grandmother.  Last visit was in October, at that time I started him on clobazam.  Grandmother reports since starting the clobazam he has been doing well actually, was seizure-free up for about 3 months after that.  But seizure return at 2 months ago during this time also [place] was noted to start using kratom. Grandmother was not aware of it until later when Zandyr told him about the Kratom.  He reported that yesterday he had 2 strong generalized convulsions back-to-back each lasting about 3 minutes.  She did not take him to the emergency department.  [place] reported he has not taken the kratom for about 2 weeks.  He is compliant with his current medications.  He continues to work.  Currently he is on clobazam 10 mg nightly lamotrigine 400 mg in the morning and 500 mg at night levetiracetam 1000 mg twice daily.   HISTORY OF PRESENT ILLNESS:  This is a 22 year old gentleman with past medical history of generalized seizure, autism spectrum disorder and depression who is presenting for management of his epilepsy.  Patient was previously under  the care of Dr. Gaynell Face for his seizure disorder.  Per chart review he has his first seizure in 2012 described as generalized tonic-clonic, at that time he had a EEG that showed high amplitude generalized spikes, polyspike or slow wave discharges that occur singly at 1 to 2 seconds of cluster.  He did have a second seizure in March 2013 while traveling in car with his father there was flickering of some light to the trees.  He began to  blink, stare and then has a generalized tonic-clonic seizure.  At that time he had a head CT which was normal.  He was initially started on Depakote but did not tolerate due to agitation. Currently he is on Keppra 1000 mg twice daily and lamotrigine 400 mg in the morning and 500 mg at night.  He is also on Klonopin 1.25 mg at bedtime.  Grandmother reports that the last seizure occurred 17-monthago while he was attending a car race.  He reported there was light on the track but the cars themselves did not have any headlights.  Prior to that his previous seizure was a couple weeks before.  They do not have a rescue medication.   Handedness: Right handed   Seizure Type: Focal seizures and generalized seizures  Current frequency: Last seizure 2 months   Any injuries from seizures: None   Seizure risk factors: Autism,   Previous ASMs: Depakote, Klonopin  Currenty ASMs: Lamotrigine 400 mg AM and 500 mg PM, Levetiracetam 1000 mg BID and Clobazam 10 mg at bedtime   ASMs side effects: None   Brain Images: normal Heat CT   Previous EEGs: High amplitude generalized spike polyspike and slow wave   OTHER MEDICAL CONDITIONS: Generalized epilepsy, ADHD, autism, depression  REVIEW OF SYSTEMS: Full 14 system review of systems performed and negative with exception of: As noted in the HPI  ALLERGIES: Allergies  Allergen Reactions   Strawberry Extract Rash    HOME MEDICATIONS: Outpatient Medications Prior to Visit  Medication Sig Dispense Refill   acetaminophen (TYLENOL) 500 MG tablet Take 500 mg by mouth every 6 (six) hours as needed for mild pain.     amantadine (SYMMETREL) 100 MG capsule TAKE 1 CAPSULE BY MOUTH TWICE A DAY 60 capsule 2   cloBAZam (ONFI) 10 MG tablet Take 1 tablet (10 mg total) by mouth at bedtime. 90 tablet 5   diazePAM, 15 MG Dose, (VALTOCO 15 MG DOSE) 2 x 7.5 MG/0.1ML LQPK Place 15 mg into the nose as needed (For seizure lasting more than 5 minutes). 2 each 5   lamoTRIgine  (LAMICTAL) 100 MG tablet 400 mg AM and 500 PM 810 tablet 3   levETIRAcetam (KEPPRA) 1000 MG tablet Take 1 tablet (1,000 mg total) by mouth 2 (two) times daily. 360 tablet 3   Melatonin 10 MG CAPS Take 10 mg by mouth at bedtime.     Multiple Vitamin (MULTIVITAMIN WITH MINERALS) TABS tablet Take 1 tablet by mouth daily.     No facility-administered medications prior to visit.    PAST MEDICAL HISTORY: Past Medical History:  Diagnosis Date   ADHD (attention deficit hyperactivity disorder)    Autism    MDD (major depressive disorder)    Post traumatic stress disorder (PTSD)    Seasonal allergies    Seizures (HCC)    Social anxiety disorder of childhood     PAST SURGICAL HISTORY: Past Surgical History:  Procedure Laterality Date   CIRCUMCISION  2002   VAGUS NERVE STIMULATOR  INSERTION Left 09/12/2022   Procedure: VAGAL NERVE STIMULATOR IMPLANT;  Surgeon: Consuella Lose, MD;  Location: [facility];  Service: Neurosurgery;  Laterality: Left;    FAMILY HISTORY: Family History  Problem Relation Age of Onset   Other Mother        Slow Learner   Seizures Mother        Had Seizures Due to Head Trauma   Other Father        Slow Learner   Asthma Sister    Seizures Maternal Grandmother        Generalized Seizures   Heart attack Maternal Grandfather        Died at 31    SOCIAL HISTORY: Social History   Socioeconomic History   Marital status: Single    Spouse name: Not on file   Number of children: Not on file   Years of education: Not on file   Highest education level: Not on file  Occupational History   Not on file  Tobacco Use   Smoking status: Never   Smokeless tobacco: Current    Types: Snuff  Vaping Use   Vaping Use: Some days  Substance and Sexual Activity   Alcohol use: No   Drug use: No   Sexual activity: Never  Other Topics Concern   Not on file  Social History Narrative   Francois is no longer in school.   He lives with his grandmother.   He enjoys working  with his hands, mowing, weed eating, drawing, and playing on his phone.   Social Determinants of Health   Financial Resource Strain: Not on file  Food Insecurity: Not on file  Transportation Needs: Not on file  Physical Activity: Not on file  Stress: Not on file  Social Connections: Not on file  Intimate Partner Violence: Not on file     PHYSICAL EXAM  GENERAL EXAM/CONSTITUTIONAL: Vitals:  Vitals:   02/02/23 1448  BP: 131/85  Pulse: 81  Weight: 178 lb (80.7 kg)  Height: '5\' 10"'$  (1.778 m)    Body mass index is 25.54 kg/m. Wt Readings from Last 3 Encounters:  02/02/23 178 lb (80.7 kg)  11/09/22 186 lb (84.4 kg)  10/03/22 179 lb (81.2 kg)   Patient is in no distress; well developed, nourished and groomed; neck is supple  EYES: Visual fields full to confrontation, Extraocular movements intacts,  No results found.  MUSCULOSKELETAL: Gait, strength, tone, movements noted in Neurologic exam below  NEUROLOGIC: MENTAL STATUS:      No data to display         awake, alert, oriented to person, place and time recent and remote memory intact normal attention and concentration language fluent, comprehension intact, naming intact   CRANIAL NERVE:  2nd, 3rd, 4th, 6th -visual fields full to confrontation, extraocular muscles intact, no nystagmus 5th - facial sensation symmetric 7th - facial strength symmetric 8th - hearing intact 9th - palate elevates symmetrically, uvula midline 11th - shoulder shrug symmetric 12th - tongue protrusion midline  MOTOR:  normal bulk and tone, full strength in the BUE, BLE  SENSORY:  normal and symmetric to light touch, pinprick, temperature, vibration  GAIT/STATION:  normal   DIAGNOSTIC DATA (LABS, IMAGING, TESTING) - I reviewed patient records, labs, notes, testing and imaging myself where available.  Lab Results  Component Value Date   WBC 6.2 09/07/2022   HGB 15.9 09/07/2022   HCT 44.8 09/07/2022   MCV 87.5 09/07/2022    PLT 246 09/07/2022  Component Value Date/Time   NA 140 09/07/2022 1600   NA 141 10/05/2021 1511   K 3.9 09/07/2022 1600   CL 106 09/07/2022 1600   CO2 29 09/07/2022 1600   GLUCOSE 92 09/07/2022 1600   BUN 9 09/07/2022 1600   BUN 5 (L) 10/05/2021 1511   CREATININE 1.13 09/07/2022 1600   CALCIUM 9.3 09/07/2022 1600   PROT 8.1 08/10/2021 0133   ALBUMIN 4.7 08/10/2021 0133   AST 24 08/10/2021 0133   ALT 31 08/10/2021 0133   ALKPHOS 98 08/10/2021 0133   BILITOT 0.9 08/10/2021 0133   GFRNONAA >60 09/07/2022 1600   GFRAA >60 06/22/2019 1431   No results found for: "CHOL", "HDL", "LDLCALC", "LDLDIRECT", "TRIG" No results found for: "HGBA1C" No results found for: "VITAMINB12" Lab Results  Component Value Date   TSH 3.194 12/29/2010    Head CT 2012 Normal head CT   EEG 2015 This is a abnormal record with the patient awake.  Is characterized by generalized spike and slow-wave activity and bilateral occipital spike and slow-wave discharges that are epileptogenic from an electrographic viewpoint.  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   ASSESSMENT AND PLAN  22 y.o. year old male  with past medical history of generalized epilepsy, mild intellectual disability, autism spectrum disorder and depression here for follow up.  He is on 3 AED including lamotrigine 400 mg in the morning and 500 mg at night, levetiracetam 1000 mg twice daily and clobazam 10 mg nightly.  He is status post [name] therapy on October 2, tolerated the procedure very well, and he had a breakthrough seizure 2 weeks ago.  Since then he has not had any additional seizures.  Plan for today will be to keep [place] on the same antiseizure medications and to increase the [name] setting.  I will see him in 2 weeks for additional [name] setting changes. He voices understanding.    1. 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 S/P placement of [name]  (vagus nerve stimulation) device      PLAN: Continue with current medications   Keppra 1000 mg twice a day   Lamictal 400 mg in the morning and 500 mg at night   Clobazam 10 mg nightly  Valtoco as rescue medication  [name] setting changes today. Tolerated procedure well  Follow up in 2 weeks for additional [name] setting changes.    Per [facility] statutes, patients with seizures are not allowed to drive until they have been seizure-free for six months.  Other recommendations include using caution when using heavy equipment or power tools. Avoid working on ladders or at heights. Take showers instead of baths.  Do not swim alone.  Ensure the water temperature is not too high on the home water heater. Do not go swimming alone. Do not lock yourself in a room alone (i.e. bathroom). When caring for infants or small children, sit down when holding, feeding, or changing them to minimize risk of injury to the child in the event you have a seizure. Maintain good sleep hygiene. Avoid alcohol.  Also recommend adequate sleep, hydration, good diet and minimize stress.   During the Seizure  - First, ensure adequate ventilation and place patients on the floor on their left side  Loosen clothing around the neck and ensure the airway is patent. If the patient is clenching the teeth, do not force the mouth open with any object as this can cause severe  damage - Remove all items from the surrounding that can be hazardous. The patient may be oblivious to what's happening and may not even know what he or she is doing. If the patient is confused and wandering, either gently guide him/her away and block access to outside areas - Reassure the individual and be comforting - Call 911. In most cases, the seizure ends before EMS arrives. However, there are cases when seizures may last over 3 to 5 minutes. Or the individual may have developed breathing difficulties or severe injuries. If a pregnant patient or a person  with diabetes develops a seizure, it is prudent to call an ambulance. - Finally, if the patient does not regain full consciousness, then call EMS. Most patients will remain confused for about 45 to 90 minutes after a seizure, so you must use judgment in calling for help. - Avoid restraints but make sure the patient is in a bed with padded side rails - Place the individual in a lateral position with the neck slightly flexed; this will help the saliva drain from the mouth and prevent the tongue from falling backward - Remove all nearby furniture and other hazards from the area - Provide verbal assurance as the individual is regaining consciousness - Provide the patient with privacy if possible - Call for help and start treatment as ordered by the caregiver   After the Seizure (Postictal Stage)  After a seizure, most patients experience confusion, fatigue, muscle pain and/or a headache. Thus, one should permit the individual to sleep. For the next few days, reassurance is essential. Being calm and helping reorient the person is also of importance.  Most seizures are painless and end spontaneously. Seizures are not harmful to others but can lead to complications such as stress on the lungs, brain and the heart. Individuals with prior lung problems may develop labored breathing and respiratory distress.     Orders Placed This Encounter  Procedures   Vagal Nerve Stimulation    No orders of the defined types were placed in this encounter.   Return in about 2 weeks (around 02/16/2023).     Alric Ran, MD 02/05/2023, 1:18 PM  [facility] [address] [place], [place] [address] [phone]

## 2023-02-02 NOTE — Patient Instructions (Signed)
Continue current medications  [name] Setting changes today, tolerated procedure very well.  Return in 2 weeks for additional [name] setting changes

## 2023-02-15 ENCOUNTER — Ambulatory Visit (INDEPENDENT_AMBULATORY_CARE_PROVIDER_SITE_OTHER): Payer: Medicaid Other | Admitting: Neurology

## 2023-02-15 ENCOUNTER — Encounter: Payer: Self-pay | Admitting: Neurology

## 2023-02-15 VITALS — BP 138/85 | HR 80 | Ht 70.0 in | Wt 180.5 lb

## 2023-02-15 DIAGNOSIS — Z9689 Presence of other specified functional implants: Secondary | ICD-10-CM

## 2023-02-15 DIAGNOSIS — G40319 Generalized idiopathic epilepsy and epileptic syndromes, intractable, without status epilepticus: Secondary | ICD-10-CM

## 2023-02-15 NOTE — Progress Notes (Unsigned)
[facility]  PATIENT: Zachary [name] DOB: [birth year]  REFERRING CLINICIAN: Corrington, Kip A, MD HISTORY FROM: Patient and Grandmother  REASON FOR VISIT: Seizures    HISTORICAL  CHIEF COMPLAINT:  Chief Complaint  Patient presents with   Follow-up    Rm 15. [name] follow up. No seizures reported since last visit. Patient with friend and grandma.    INTERVAL HISTORY 02/15/2023:  Zachary [name] present today for follow-up, he is accompanied by mother and cousin.  Last visit was on February 22.  His [name] setting at that time was increased.  He tolerated procedure very well, denies any side effect.  Again denies any side effect from the seizure medication and no seizures since last visit.  Overall he is stable.  He is ready for another [name] setting changes.   INTERVAL HISTORY 02/02/2023 Zachary [name] presents today for follow-up.  He is accompanied by grandmother.  Last visit was in November 29.  He missed a couple appointments.  He reported having a breakthrough seizure 2 weeks ago.  Grandmother reports the seizure being a generalized tonic-clonic seizure, she did not use the Valtoco as rescue medication.  He might have made mistake regarding his antiseizure medications, Keppra and the lamotrigine but unsure which mistake he made because both of his medication are twice a day and he felt like he took his medications twice that day.  Unclear but he still had a seizure. Since then he has not had any additional seizures.  [name] was turned on at last visit, he has tolerated well.    INTERVAL HISTORY 11/09/22:  Zachary [name] presents today for follow-up, he is accompanied by both grandmother and father.  Last visit was on October 23, since then he has not had any seizures.  He is tolerating the [name] very well.  Denies any excessive coughing, denies any hoarseness of voice.  Overall no complaints doing well.   INTERVAL HISTORY 10/03/22:  Patient presents today for follow-up, he is accompanied by  grandmother.  He has a [name] placed on October 2.  Since then he has not had any seizure or seizure-like activity.  Overall doing well.  Tolerated the procedure very well.   INTERVAL HISTORY 07/12/22:  Zachary [name] presents today with grandmother. At last visit, plan was to start Cenobamate, after taking the medication for about  week, he has worsening behavior, increase aggression and the medication was discontinue and his behavior is back to normal. He denies any additional seizure since last visit. Currently he is on Keppra, Lamotrigine and Clobazam.  He has not completed the EEG yet.    INTERVAL HISTORY 05/23/2022:  Zachary [name] presents today for follow-up.  He is accompanied by his grandmother.  Last visit was in October, at that time I started him on clobazam.  Grandmother reports since starting the clobazam he has been doing well actually, was seizure-free up for about 3 months after that.  But seizure return at 2 months ago during this time also Zachary [name] was noted to start using kratom. Grandmother was not aware of it until later when Zachary [name] told him about the Kratom.  He reported that yesterday he had 2 strong generalized convulsions back-to-back each lasting about 3 minutes.  She did not take him to the emergency department.  Zachary [name] reported he has not taken the kratom for about 2 weeks.  He is compliant with his current medications.  He continues to work.  Currently he is on clobazam 10 mg nightly lamotrigine 400 mg in the morning and 500  mg at night levetiracetam 1000 mg twice daily.   HISTORY OF PRESENT ILLNESS:  This is a 22 year old gentleman with past medical history of generalized seizure, autism spectrum disorder and depression who is presenting for management of his epilepsy.  Patient was previously under the care of Dr. Gaynell Face for his seizure disorder.  Per chart review he has his first seizure in 2012 described as generalized tonic-clonic, at that time he had a EEG that showed high amplitude  generalized spikes, polyspike or slow wave discharges that occur singly at 1 to 2 seconds of cluster.  He did have a second seizure in March 2013 while traveling in car with his father there was flickering of some light to the trees.  He began to blink, stare and then has a generalized tonic-clonic seizure.  At that time he had a head CT which was normal.  He was initially started on Depakote but did not tolerate due to agitation. Currently he is on Keppra 1000 mg twice daily and lamotrigine 400 mg in the morning and 500 mg at night.  He is also on Klonopin 1.25 mg at bedtime.  Grandmother reports that the last seizure occurred 15-monthago while he was attending a car race.  He reported there was light on the track but the cars themselves did not have any headlights.  Prior to that his previous seizure was a couple weeks before.  They do not have a rescue medication.   Handedness: Right handed   Seizure Type: Focal seizures and generalized seizures  Current frequency: Last seizure 2 months   Any injuries from seizures: None   Seizure risk factors: Autism,   Previous ASMs: Depakote, Klonopin  Currenty ASMs: Lamotrigine 400 mg AM and 500 mg PM, Levetiracetam 1000 mg BID and Clobazam 10 mg at bedtime   ASMs side effects: None   Brain Images: normal Heat CT   Previous EEGs: High amplitude generalized spike polyspike and slow wave   OTHER MEDICAL CONDITIONS: Generalized epilepsy, ADHD, autism, depression  REVIEW OF SYSTEMS: Full 14 system review of systems performed and negative with exception of: As noted in the HPI  ALLERGIES: Allergies  Allergen Reactions   Strawberry Extract Rash    HOME MEDICATIONS: Outpatient Medications Prior to Visit  Medication Sig Dispense Refill   acetaminophen (TYLENOL) 500 MG tablet Take 500 mg by mouth every 6 (six) hours as needed for mild pain.     amantadine (SYMMETREL) 100 MG capsule TAKE 1 CAPSULE BY MOUTH TWICE A DAY 60 capsule 2   cloBAZam  (ONFI) 10 MG tablet Take 1 tablet (10 mg total) by mouth at bedtime. 90 tablet 5   diazePAM, 15 MG Dose, (VALTOCO 15 MG DOSE) 2 x 7.5 MG/0.1ML LQPK Place 15 mg into the nose as needed (For seizure lasting more than 5 minutes). 2 each 5   lamoTRIgine (LAMICTAL) 100 MG tablet 400 mg AM and 500 PM 810 tablet 3   levETIRAcetam (KEPPRA) 1000 MG tablet Take 1 tablet (1,000 mg total) by mouth 2 (two) times daily. 360 tablet 3   Melatonin 10 MG CAPS Take 10 mg by mouth at bedtime.     Multiple Vitamin (MULTIVITAMIN WITH MINERALS) TABS tablet Take 1 tablet by mouth daily.     No facility-administered medications prior to visit.    PAST MEDICAL HISTORY: Past Medical History:  Diagnosis Date   ADHD (attention deficit hyperactivity disorder)    Autism    MDD (major depressive disorder)    Post traumatic  stress disorder (PTSD)    Seasonal allergies    Seizures ([place])    Social anxiety disorder of childhood     PAST SURGICAL HISTORY: Past Surgical History:  Procedure Laterality Date   CIRCUMCISION  2002   VAGUS NERVE STIMULATOR INSERTION Left 09/12/2022   Procedure: VAGAL NERVE STIMULATOR IMPLANT;  Surgeon: Consuella Lose, MD;  Location: [facility];  Service: Neurosurgery;  Laterality: Left;    FAMILY HISTORY: Family History  Problem Relation Age of Onset   Other Mother        Slow Learner   Seizures Mother        Had Seizures Due to Head Trauma   Other Father        Slow Learner   Asthma Sister    Seizures Maternal Grandmother        Generalized Seizures   Heart attack Maternal Grandfather        Died at 4    SOCIAL HISTORY: Social History   Socioeconomic History   Marital status: Single    Spouse name: Not on file   Number of children: Not on file   Years of education: Not on file   Highest education level: Not on file  Occupational History   Not on file  Tobacco Use   Smoking status: Never   Smokeless tobacco: Current    Types: Snuff  Vaping Use   Vaping Use: Some  days  Substance and Sexual Activity   Alcohol use: No   Drug use: No   Sexual activity: Never  Other Topics Concern   Not on file  Social History Narrative   Zachary [name] is no longer in school.   He lives with his grandmother.   He enjoys working with his hands, mowing, weed eating, drawing, and playing on his phone.   Social Determinants of Health   Financial Resource Strain: Not on file  Food Insecurity: Not on file  Transportation Needs: Not on file  Physical Activity: Not on file  Stress: Not on file  Social Connections: Not on file  Intimate Partner Violence: Not on file     PHYSICAL EXAM  GENERAL EXAM/CONSTITUTIONAL: Vitals:  Vitals:   02/15/23 1445  BP: 138/85  Pulse: 80  Weight: 180 lb 8 oz (81.9 kg)  Height: '5\' 10"'$  (1.778 m)    Body mass index is 25.9 kg/m. Wt Readings from Last 3 Encounters:  02/15/23 180 lb 8 oz (81.9 kg)  02/02/23 178 lb (80.7 kg)  11/09/22 186 lb (84.4 kg)   Patient is in no distress; well developed, nourished and groomed; neck is supple  EYES: Visual fields full to confrontation, Extraocular movements intacts,  No results found.  MUSCULOSKELETAL: Gait, strength, tone, movements noted in Neurologic exam below  NEUROLOGIC: MENTAL STATUS:      No data to display         awake, alert, oriented to person, place and time recent and remote memory intact normal attention and concentration language fluent, comprehension intact, naming intact   CRANIAL NERVE:  2nd, 3rd, 4th, 6th -visual fields full to confrontation, extraocular muscles intact, no nystagmus 5th - facial sensation symmetric 7th - facial strength symmetric 8th - hearing intact 9th - palate elevates symmetrically, uvula midline 11th - shoulder shrug symmetric 12th - tongue protrusion midline  MOTOR:  normal bulk and tone, full strength in the BUE, BLE  SENSORY:  normal and symmetric to light touch, pinprick, temperature, vibration  GAIT/STATION:   normal   DIAGNOSTIC DATA (LABS,  IMAGING, TESTING) - I reviewed patient records, labs, notes, testing and imaging myself where available.  Lab Results  Component Value Date   WBC 6.2 09/07/2022   HGB 15.9 09/07/2022   HCT 44.8 09/07/2022   MCV 87.5 09/07/2022   PLT 246 09/07/2022      Component Value Date/Time   NA 140 09/07/2022 1600   NA 141 10/05/2021 1511   K 3.9 09/07/2022 1600   CL 106 09/07/2022 1600   CO2 29 09/07/2022 1600   GLUCOSE 92 09/07/2022 1600   BUN 9 09/07/2022 1600   BUN 5 (L) 10/05/2021 1511   CREATININE 1.13 09/07/2022 1600   CALCIUM 9.3 09/07/2022 1600   PROT 8.1 08/10/2021 0133   ALBUMIN 4.7 08/10/2021 0133   AST 24 08/10/2021 0133   ALT 31 08/10/2021 0133   ALKPHOS 98 08/10/2021 0133   BILITOT 0.9 08/10/2021 0133   GFRNONAA >60 09/07/2022 1600   GFRAA >60 06/22/2019 1431   No results found for: "CHOL", "HDL", "LDLCALC", "LDLDIRECT", "TRIG" No results found for: "HGBA1C" No results found for: "VITAMINB12" Lab Results  Component Value Date   TSH 3.194 12/29/2010    Head CT 2012 Normal head CT   EEG 2015 This is a abnormal record with the patient awake.  Is characterized by generalized spike and slow-wave activity and bilateral occipital spike and slow-wave discharges that are epileptogenic from an electrographic viewpoint.  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   ASSESSMENT AND PLAN  22 y.o. year old male  with past medical history of generalized epilepsy, mild intellectual disability, autism spectrum disorder and depression here for follow up.  He is on 3 AED including lamotrigine 400 mg in the morning and 500 mg at night, levetiracetam 1000 mg twice daily and clobazam 10 mg nightly.  He is status post [name] therapy on October 2, tolerated the procedure very well, and he had a breakthrough seizure early February 2024.  Since then he has not had any additional seizures.  Plan for today will be to  keep [place] on the same antiseizure medications and to increase the [name] setting.  I will see him in 2 weeks for additional [name] setting changes. He voices understanding.    1. 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 S/P placement of [name] (vagus nerve stimulation) device       PLAN: Continue with current medications   Keppra 1000 mg twice a day   Lamictal 400 mg in the morning and 500 mg at night   Clobazam 10 mg nightly  Valtoco as rescue medication  [name] setting changes today. Tolerated procedure well  Follow up in 2 weeks for additional [name] setting changes.    Per [facility] statutes, patients with seizures are not allowed to drive until they have been seizure-free for six months.  Other recommendations include using caution when using heavy equipment or power tools. Avoid working on ladders or at heights. Take showers instead of baths.  Do not swim alone.  Ensure the water temperature is not too high on the home water heater. Do not go swimming alone. Do not lock yourself in a room alone (i.e. bathroom). When caring for infants or small children, sit down when holding, feeding, or changing them to minimize risk of injury to the child in the event you have a seizure. Maintain good sleep hygiene. Avoid alcohol.  Also recommend adequate sleep, hydration, good diet and minimize stress.  During the Seizure  - First, ensure adequate ventilation and place patients on the floor on their left side  Loosen clothing around the neck and ensure the airway is patent. If the patient is clenching the teeth, do not force the mouth open with any object as this can cause severe damage - Remove all items from the surrounding that can be hazardous. The patient may be oblivious to what's happening and may not even know what he or she is doing. If the patient is confused and wandering, either gently guide him/her away and block access to outside areas -  Reassure the individual and be comforting - Call 911. In most cases, the seizure ends before EMS arrives. However, there are cases when seizures may last over 3 to 5 minutes. Or the individual may have developed breathing difficulties or severe injuries. If a pregnant patient or a person with diabetes develops a seizure, it is prudent to call an ambulance. - Finally, if the patient does not regain full consciousness, then call EMS. Most patients will remain confused for about 45 to 90 minutes after a seizure, so you must use judgment in calling for help. - Avoid restraints but make sure the patient is in a bed with padded side rails - Place the individual in a lateral position with the neck slightly flexed; this will help the saliva drain from the mouth and prevent the tongue from falling backward - Remove all nearby furniture and other hazards from the area - Provide verbal assurance as the individual is regaining consciousness - Provide the patient with privacy if possible - Call for help and start treatment as ordered by the caregiver   After the Seizure (Postictal Stage)  After a seizure, most patients experience confusion, fatigue, muscle pain and/or a headache. Thus, one should permit the individual to sleep. For the next few days, reassurance is essential. Being calm and helping reorient the person is also of importance.  Most seizures are painless and end spontaneously. Seizures are not harmful to others but can lead to complications such as stress on the lungs, brain and the heart. Individuals with prior lung problems may develop labored breathing and respiratory distress.     Orders Placed This Encounter  Procedures   Vagal Nerve Stimulation    No orders of the defined types were placed in this encounter.   No follow-ups on file.     Alric Ran, MD 02/15/2023, 8:33 PM  [facility] [address] [place], [place] [address] [phone]

## 2023-03-01 ENCOUNTER — Encounter: Payer: Self-pay | Admitting: Neurology

## 2023-03-01 ENCOUNTER — Ambulatory Visit (INDEPENDENT_AMBULATORY_CARE_PROVIDER_SITE_OTHER): Payer: Medicaid Other | Admitting: Neurology

## 2023-03-01 VITALS — BP 127/77 | HR 76 | Ht 70.0 in | Wt 180.0 lb

## 2023-03-01 DIAGNOSIS — Z9689 Presence of other specified functional implants: Secondary | ICD-10-CM

## 2023-03-01 DIAGNOSIS — G40319 Generalized idiopathic epilepsy and epileptic syndromes, intractable, without status epilepticus: Secondary | ICD-10-CM | POA: Diagnosis not present

## 2023-03-01 NOTE — Patient Instructions (Signed)
Continue current medications including clobazam 10 mg at bedtime, lamotrigine 400 mg in the morning and 500 mg in the evening and Keppra 1000 mg twice daily [name] settings changed today, patient tolerated procedure very well Follow-up as scheduled for additional [name] setting changes.

## 2023-03-01 NOTE — Progress Notes (Signed)
[facility]  PATIENT: Zachary [name] DOB: 2001/10/11  REFERRING CLINICIAN: Corrington, Kip A, MD HISTORY FROM: Patient and Grandmother  REASON FOR VISIT: Seizures    HISTORICAL  CHIEF COMPLAINT:  Chief Complaint  Patient presents with   Follow-up    VSN fu, doing well    INTERVAL HISTORY 03/01/2023:  Zachary [name] presents today for follow-up, he is accompanied by grandmother.  Last visit was on March 6.  Since then he has not had any seizure or seizure-like activity.  His [name] setting was changed at that time and he tolerated procedure well.  He reports swiping his [name] at least 2-3 times per day.  Overall tolerating the [name] changes very well, no seizure, no concerns and no other complaints.   INTERVAL HISTORY 02/15/2023:  Zachary [name] present today for follow-up, he is accompanied by mother and cousin.  Last visit was on February 22.  His [name] setting at that time was increased.  He tolerated procedure very well, denies any side effect.  Again denies any side effect from the seizure medication and no seizures since last visit.  Overall he is stable.  He is ready for another [name] setting changes.   INTERVAL HISTORY 02/02/2023 Zachary [name] presents today for follow-up.  He is accompanied by grandmother.  Last visit was in November 29.  He missed a couple appointments.  He reported having a breakthrough seizure 2 weeks ago.  Grandmother reports the seizure being a generalized tonic-clonic seizure, she did not use the Valtoco as rescue medication.  He might have made mistake regarding his antiseizure medications, Keppra and the lamotrigine but unsure which mistake he made because both of his medication are twice a day and he felt like he took his medications twice that day.  Unclear but he still had a seizure. Since then he has not had any additional seizures.  [name] was turned on at last visit, he has tolerated well.    INTERVAL HISTORY 11/09/22:  Zachary [name] presents today for follow-up, he is  accompanied by both grandmother and father.  Last visit was on October 23, since then he has not had any seizures.  He is tolerating the [name] very well.  Denies any excessive coughing, denies any hoarseness of voice.  Overall no complaints doing well.   INTERVAL HISTORY 10/03/22:  Patient presents today for follow-up, he is accompanied by grandmother.  He has a [name] placed on October 2.  Since then he has not had any seizure or seizure-like activity.  Overall doing well.  Tolerated the procedure very well.   INTERVAL HISTORY 07/12/22:  Zachary [name] presents today with grandmother. At last visit, plan was to start Cenobamate, after taking the medication for about  week, he has worsening behavior, increase aggression and the medication was discontinue and his behavior is back to normal. He denies any additional seizure since last visit. Currently he is on Keppra, Lamotrigine and Clobazam.  He has not completed the EEG yet.    INTERVAL HISTORY 05/23/2022:  Zachary [name] presents today for follow-up.  He is accompanied by his grandmother.  Last visit was in October, at that time I started him on clobazam.  Grandmother reports since starting the clobazam he has been doing well actually, was seizure-free up for about 3 months after that.  But seizure return at 2 months ago during this time also Zachary [name] was noted to start using kratom. Grandmother was not aware of it until later when Zachary [name] told him about the Kratom.  He reported that yesterday  he had 2 strong generalized convulsions back-to-back each lasting about 3 minutes.  She did not take him to the emergency department.  Kentrell reported he has not taken the kratom for about 2 weeks.  He is compliant with his current medications.  He continues to work.  Currently he is on clobazam 10 mg nightly lamotrigine 400 mg in the morning and 500 mg at night levetiracetam 1000 mg twice daily.   HISTORY OF PRESENT ILLNESS:  This is a 22 year old gentleman with past medical history  of generalized seizure, autism spectrum disorder and depression who is presenting for management of his epilepsy.  Patient was previously under the care of Dr. Gaynell Face for his seizure disorder.  Per chart review he has his first seizure in 2012 described as generalized tonic-clonic, at that time he had a EEG that showed high amplitude generalized spikes, polyspike or slow wave discharges that occur singly at 1 to 2 seconds of cluster.  He did have a second seizure in March 2013 while traveling in car with his father there was flickering of some light to the trees.  He began to blink, stare and then has a generalized tonic-clonic seizure.  At that time he had a head CT which was normal.  He was initially started on Depakote but did not tolerate due to agitation. Currently he is on Keppra 1000 mg twice daily and lamotrigine 400 mg in the morning and 500 mg at night.  He is also on Klonopin 1.25 mg at bedtime.  Grandmother reports that the last seizure occurred 6-month ago while he was attending a car race.  He reported there was light on the track but the cars themselves did not have any headlights.  Prior to that his previous seizure was a couple weeks before.  They do not have a rescue medication.   Handedness: Right handed   Seizure Type: Focal seizures and generalized seizures  Current frequency: Last seizure 2 months   Any injuries from seizures: None   Seizure risk factors: Autism,   Previous ASMs: Depakote, Klonopin  Currenty ASMs: Lamotrigine 400 mg AM and 500 mg PM, Levetiracetam 1000 mg BID and Clobazam 10 mg at bedtime   ASMs side effects: None   Brain Images: normal Heat CT   Previous EEGs: High amplitude generalized spike polyspike and slow wave   OTHER MEDICAL CONDITIONS: Generalized epilepsy, ADHD, autism, depression  REVIEW OF SYSTEMS: Full 14 system review of systems performed and negative with exception of: As noted in the HPI  ALLERGIES: Allergies  Allergen Reactions    Strawberry Extract Rash    HOME MEDICATIONS: Outpatient Medications Prior to Visit  Medication Sig Dispense Refill   acetaminophen (TYLENOL) 500 MG tablet Take 500 mg by mouth every 6 (six) hours as needed for mild pain.     amantadine (SYMMETREL) 100 MG capsule TAKE 1 CAPSULE BY MOUTH TWICE A DAY 60 capsule 2   cloBAZam (ONFI) 10 MG tablet Take 1 tablet (10 mg total) by mouth at bedtime. 90 tablet 5   diazePAM, 15 MG Dose, (VALTOCO 15 MG DOSE) 2 x 7.5 MG/0.1ML LQPK Place 15 mg into the nose as needed (For seizure lasting more than 5 minutes). 2 each 5   lamoTRIgine (LAMICTAL) 100 MG tablet 400 mg AM and 500 PM 810 tablet 3   levETIRAcetam (KEPPRA) 1000 MG tablet Take 1 tablet (1,000 mg total) by mouth 2 (two) times daily. 360 tablet 3   Melatonin 10 MG CAPS Take 10 mg by  mouth at bedtime.     Multiple Vitamin (MULTIVITAMIN WITH MINERALS) TABS tablet Take 1 tablet by mouth daily.     No facility-administered medications prior to visit.    PAST MEDICAL HISTORY: Past Medical History:  Diagnosis Date   ADHD (attention deficit hyperactivity disorder)    Autism    MDD (major depressive disorder)    Post traumatic stress disorder (PTSD)    Seasonal allergies    Seizures ([place])    Social anxiety disorder of childhood     PAST SURGICAL HISTORY: Past Surgical History:  Procedure Laterality Date   CIRCUMCISION  2002   VAGUS NERVE STIMULATOR INSERTION Left 09/12/2022   Procedure: VAGAL NERVE STIMULATOR IMPLANT;  Surgeon: Consuella Lose, MD;  Location: [place];  Service: Neurosurgery;  Laterality: Left;    FAMILY HISTORY: Family History  Problem Relation Age of Onset   Other Mother        Slow Learner   Seizures Mother        Had Seizures Due to Head Trauma   Other Father        Slow Learner   Asthma Sister    Seizures Maternal Grandmother        Generalized Seizures   Heart attack Maternal Grandfather        Died at 63    SOCIAL HISTORY: Social History   Socioeconomic  History   Marital status: Single    Spouse name: Not on file   Number of children: Not on file   Years of education: Not on file   Highest education level: Not on file  Occupational History   Not on file  Tobacco Use   Smoking status: Never   Smokeless tobacco: Current    Types: Snuff  Vaping Use   Vaping Use: Some days  Substance and Sexual Activity   Alcohol use: No   Drug use: No   Sexual activity: Never  Other Topics Concern   Not on file  Social History Narrative   Zachary [name] is no longer in school.   He lives with his grandmother.   He enjoys working with his hands, mowing, weed eating, drawing, and playing on his phone.   Social Determinants of Health   Financial Resource Strain: Not on file  Food Insecurity: Not on file  Transportation Needs: Not on file  Physical Activity: Not on file  Stress: Not on file  Social Connections: Not on file  Intimate Partner Violence: Not on file     PHYSICAL EXAM  GENERAL EXAM/CONSTITUTIONAL: Vitals:  Vitals:   03/01/23 1546  BP: 127/77  Pulse: 76  Weight: 180 lb (81.6 kg)  Height: 5\' 10"  (1.778 m)     Body mass index is 25.83 kg/m. Wt Readings from Last 3 Encounters:  03/01/23 180 lb (81.6 kg)  02/15/23 180 lb 8 oz (81.9 kg)  02/02/23 178 lb (80.7 kg)   Patient is in no distress; well developed, nourished and groomed; neck is supple  EYES: Visual fields full to confrontation, Extraocular movements intacts,  No results found.  MUSCULOSKELETAL: Gait, strength, tone, movements noted in Neurologic exam below  NEUROLOGIC: MENTAL STATUS:      No data to display         awake, alert, oriented to person, place and time recent and remote memory intact normal attention and concentration language fluent, comprehension intact, naming intact   CRANIAL NERVE:  2nd, 3rd, 4th, 6th -visual fields full to confrontation, extraocular muscles intact, no nystagmus 5th -  facial sensation symmetric 7th - facial strength  symmetric 8th - hearing intact 9th - palate elevates symmetrically, uvula midline 11th - shoulder shrug symmetric 12th - tongue protrusion midline  MOTOR:  normal bulk and tone, full strength in the BUE, BLE  SENSORY:  normal and symmetric to light touch, pinprick, temperature, vibration  GAIT/STATION:  normal   DIAGNOSTIC DATA (LABS, IMAGING, TESTING) - I reviewed patient records, labs, notes, testing and imaging myself where available.  Lab Results  Component Value Date   WBC 6.2 09/07/2022   HGB 15.9 09/07/2022   HCT 44.8 09/07/2022   MCV 87.5 09/07/2022   PLT 246 09/07/2022      Component Value Date/Time   NA 140 09/07/2022 1600   NA 141 10/05/2021 1511   K 3.9 09/07/2022 1600   CL 106 09/07/2022 1600   CO2 29 09/07/2022 1600   GLUCOSE 92 09/07/2022 1600   BUN 9 09/07/2022 1600   BUN 5 (L) 10/05/2021 1511   CREATININE 1.13 09/07/2022 1600   CALCIUM 9.3 09/07/2022 1600   PROT 8.1 08/10/2021 0133   ALBUMIN 4.7 08/10/2021 0133   AST 24 08/10/2021 0133   ALT 31 08/10/2021 0133   ALKPHOS 98 08/10/2021 0133   BILITOT 0.9 08/10/2021 0133   GFRNONAA >60 09/07/2022 1600   GFRAA >60 06/22/2019 1431   No results found for: "CHOL", "HDL", "LDLCALC", "LDLDIRECT", "TRIG" No results found for: "HGBA1C" No results found for: "VITAMINB12" Lab Results  Component Value Date   TSH 3.194 12/29/2010    Head CT 2012 Normal head CT   EEG 2015 This is a abnormal record with the patient awake.  Is characterized by generalized spike and slow-wave activity and bilateral occipital spike and slow-wave discharges that are epileptogenic from an electrographic viewpoint.  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   ASSESSMENT AND PLAN  22 y.o. year old male  with past medical history of generalized epilepsy, mild intellectual disability, autism spectrum disorder and depression here for follow up.  He is on 3 AED including lamotrigine 400 mg  in the morning and 500 mg at night, levetiracetam 1000 mg twice daily and clobazam 10 mg nightly.  He is status post [name] therapy on October 2, tolerated the procedure very well, and he had a breakthrough seizure early February 2024.  Since then he has not had any additional seizures.  Plan for today will be to keep [place] on the same antiseizure medications and to increase the [name] setting.  I will see him in 2 weeks for additional [name] setting changes. He voices understanding.    1. 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 S/P placement of [name] (vagus nerve stimulation) device      PLAN: Continue with current medications   Keppra 1000 mg twice a day   Lamictal 400 mg in the morning and 500 mg at night   Clobazam 10 mg nightly  Valtoco as rescue medication  [name] setting changes today. Tolerated procedure well  Follow up in 2 weeks for additional [name] setting changes.    Per [facility] statutes, patients with seizures are not allowed to drive until they have been seizure-free for six months.  Other recommendations include using caution when using heavy equipment or power tools. Avoid working on ladders or at heights. Take showers instead of baths.  Do not swim alone.  Ensure the water temperature is not too high on the home water heater.  Do not go swimming alone. Do not lock yourself in a room alone (i.e. bathroom). When caring for infants or small children, sit down when holding, feeding, or changing them to minimize risk of injury to the child in the event you have a seizure. Maintain good sleep hygiene. Avoid alcohol.  Also recommend adequate sleep, hydration, good diet and minimize stress.   During the Seizure  - First, ensure adequate ventilation and place patients on the floor on their left side  Loosen clothing around the neck and ensure the airway is patent. If the patient is clenching the teeth, do not force the mouth open with any object as  this can cause severe damage - Remove all items from the surrounding that can be hazardous. The patient may be oblivious to what's happening and may not even know what he or she is doing. If the patient is confused and wandering, either gently guide him/her away and block access to outside areas - Reassure the individual and be comforting - Call 911. In most cases, the seizure ends before EMS arrives. However, there are cases when seizures may last over 3 to 5 minutes. Or the individual may have developed breathing difficulties or severe injuries. If a pregnant patient or a person with diabetes develops a seizure, it is prudent to call an ambulance. - Finally, if the patient does not regain full consciousness, then call EMS. Most patients will remain confused for about 45 to 90 minutes after a seizure, so you must use judgment in calling for help. - Avoid restraints but make sure the patient is in a bed with padded side rails - Place the individual in a lateral position with the neck slightly flexed; this will help the saliva drain from the mouth and prevent the tongue from falling backward - Remove all nearby furniture and other hazards from the area - Provide verbal assurance as the individual is regaining consciousness - Provide the patient with privacy if possible - Call for help and start treatment as ordered by the caregiver   After the Seizure (Postictal Stage)  After a seizure, most patients experience confusion, fatigue, muscle pain and/or a headache. Thus, one should permit the individual to sleep. For the next few days, reassurance is essential. Being calm and helping reorient the person is also of importance.  Most seizures are painless and end spontaneously. Seizures are not harmful to others but can lead to complications such as stress on the lungs, brain and the heart. Individuals with prior lung problems may develop labored breathing and respiratory distress.     Orders Placed  This Encounter  Procedures   Vagal Nerve Stimulation    No orders of the defined types were placed in this encounter.   No follow-ups on file.     Alric Ran, MD 03/01/2023, 4:03 PM  [facility] [address], [place], [place] [address] [phone]

## 2023-03-15 ENCOUNTER — Ambulatory Visit: Payer: Medicaid Other | Admitting: Neurology

## 2023-03-15 ENCOUNTER — Telehealth: Payer: Self-pay | Admitting: Neurology

## 2023-03-15 NOTE — Telephone Encounter (Signed)
Pt grandmother called, Stated pt had three seizures back to back. Stated she tried to swipe [name] but it didn't work. Stated the seizures only lasted a few seconds. Pt had an appointment schedule with Dr. April Manson today but grandmother is out of town at an appointment.

## 2023-04-04 ENCOUNTER — Ambulatory Visit: Payer: Medicaid Other | Admitting: Neurology

## 2023-04-05 ENCOUNTER — Encounter: Payer: Self-pay | Admitting: Neurology

## 2023-04-05 ENCOUNTER — Ambulatory Visit (INDEPENDENT_AMBULATORY_CARE_PROVIDER_SITE_OTHER): Payer: Medicaid Other | Admitting: Neurology

## 2023-04-05 VITALS — BP 129/87 | HR 93 | Ht 71.0 in | Wt 179.9 lb

## 2023-04-05 DIAGNOSIS — Z9689 Presence of other specified functional implants: Secondary | ICD-10-CM | POA: Diagnosis not present

## 2023-04-05 DIAGNOSIS — G40319 Generalized idiopathic epilepsy and epileptic syndromes, intractable, without status epilepticus: Secondary | ICD-10-CM

## 2023-04-05 NOTE — Progress Notes (Signed)
[facility]  PATIENT: Zachary [name] DOB: 03/30/2001  REFERRING CLINICIAN: Corrington, Kip A, MD HISTORY FROM: Patient and Grandmother  REASON FOR VISIT: Seizures    HISTORICAL  CHIEF COMPLAINT:  Chief Complaint  Patient presents with   Follow-up    Rm 15, grandmother and cousin present  [name] check  3 seizures back to back 2 weeks ago according to grandmother   INTERVAL HISTORY 04/05/2023:  Zachary [name] presents today for follow-up, he is accompanied by grandmother.  Last visit was on March 20 at that time we had increased his [name] settings and he tolerated the procedure very well.  States that he has been doing well until April 3 when he had a breakthrough seizure.  Per grandmother he had a cluster of 3 seizures while fishing.  He did fall to the water but luckily his cousin was there and got him out of the water.  There is a possibility that he had missed his medication because he has run out.  Since then he has been doing well.  No additional seizures.  He continued to swipe his [name] and no no side effect.   INTERVAL HISTORY 03/01/2023:  Zachary [name] presents today for follow-up, he is accompanied by grandmother.  Last visit was on March 6.  Since then he has not had any seizure or seizure-like activity.  His [name] setting was changed at that time and he tolerated procedure well.  He reports swiping his [name] at least 2-3 times per day.  Overall tolerating the [name] changes very well, no seizure, no concerns and no other complaints.   INTERVAL HISTORY 02/15/2023:  Zachary [name] present today for follow-up, he is accompanied by mother and cousin.  Last visit was on February 22.  His [name] setting at that time was increased.  He tolerated procedure very well, denies any side effect.  Again denies any side effect from the seizure medication and no seizures since last visit.  Overall he is stable.  He is ready for another [name] setting changes.   INTERVAL HISTORY 02/02/2023 Zachary [name] presents today for  follow-up.  He is accompanied by grandmother.  Last visit was in November 29.  He missed a couple appointments.  He reported having a breakthrough seizure 2 weeks ago.  Grandmother reports the seizure being a generalized tonic-clonic seizure, she did not use the Valtoco as rescue medication.  He might have made mistake regarding his antiseizure medications, Keppra and the lamotrigine but unsure which mistake he made because both of his medication are twice a day and he felt like he took his medications twice that day.  Unclear but he still had a seizure. Since then he has not had any additional seizures.  [name] was turned on at last visit, he has tolerated well.    INTERVAL HISTORY 11/09/22:  Zachary [name] presents today for follow-up, he is accompanied by both grandmother and father.  Last visit was on October 23, since then he has not had any seizures.  He is tolerating the [name] very well.  Denies any excessive coughing, denies any hoarseness of voice.  Overall no complaints doing well.   INTERVAL HISTORY 10/03/22:  Patient presents today for follow-up, he is accompanied by grandmother.  He has a [name] placed on October 2.  Since then he has not had any seizure or seizure-like activity.  Overall doing well.  Tolerated the procedure very well.   INTERVAL HISTORY 07/12/22:  Zachary [name] presents today with grandmother. At last visit, plan was to start Cenobamate,  after taking the medication for about  week, he has worsening behavior, increase aggression and the medication was discontinue and his behavior is back to normal. He denies any additional seizure since last visit. Currently he is on Keppra, Lamotrigine and Clobazam.  He has not completed the EEG yet.    INTERVAL HISTORY 05/23/2022:  Zachary [name] presents today for follow-up.  He is accompanied by his grandmother.  Last visit was in October, at that time I started him on clobazam.  Grandmother reports since starting the clobazam he has been doing well actually, was  seizure-free up for about 3 months after that.  But seizure return at 2 months ago during this time also Zachary [name] was noted to start using kratom. Grandmother was not aware of it until later when Zachary [name] told him about the Kratom.  He reported that yesterday he had 2 strong generalized convulsions back-to-back each lasting about 3 minutes.  She did not take him to the emergency department.  Zachary [name] reported he has not taken the kratom for about 2 weeks.  He is compliant with his current medications.  He continues to work.  Currently he is on clobazam 10 mg nightly lamotrigine 400 mg in the morning and 500 mg at night levetiracetam 1000 mg twice daily.   HISTORY OF PRESENT ILLNESS:  This is a 22 year old gentleman with past medical history of generalized seizure, autism spectrum disorder and depression who is presenting for management of his epilepsy.  Patient was previously under the care of Dr. Sharene Skeans for his seizure disorder.  Per chart review he has his first seizure in 2012 described as generalized tonic-clonic, at that time he had a EEG that showed high amplitude generalized spikes, polyspike or slow wave discharges that occur singly at 1 to 2 seconds of cluster.  He did have a second seizure in March 2013 while traveling in car with his father there was flickering of some light to the trees.  He began to blink, stare and then has a generalized tonic-clonic seizure.  At that time he had a head CT which was normal.  He was initially started on Depakote but did not tolerate due to agitation. Currently he is on Keppra 1000 mg twice daily and lamotrigine 400 mg in the morning and 500 mg at night.  He is also on Klonopin 1.25 mg at bedtime.  Grandmother reports that the last seizure occurred 23-month ago while he was attending a car race.  He reported there was light on the track but the cars themselves did not have any headlights.  Prior to that his previous seizure was a couple weeks before.  They do not have a  rescue medication.   Handedness: Right handed   Seizure Type: Focal seizures and generalized seizures  Current frequency: Last seizure 2 months   Any injuries from seizures: None   Seizure risk factors: Autism,   Previous ASMs: Depakote, Klonopin  Currenty ASMs: Lamotrigine 400 mg AM and 500 mg PM, Levetiracetam 1000 mg BID and Clobazam 10 mg at bedtime   ASMs side effects: None   Brain Images: normal Heat CT   Previous EEGs: High amplitude generalized spike polyspike and slow wave   OTHER MEDICAL CONDITIONS: Generalized epilepsy, ADHD, autism, depression  REVIEW OF SYSTEMS: Full 14 system review of systems performed and negative with exception of: As noted in the HPI  ALLERGIES: Allergies  Allergen Reactions   Strawberry Extract Rash    HOME MEDICATIONS: Outpatient Medications Prior to Visit  Medication Sig  Dispense Refill   acetaminophen (TYLENOL) 500 MG tablet Take 500 mg by mouth every 6 (six) hours as needed for mild pain.     amantadine (SYMMETREL) 100 MG capsule TAKE 1 CAPSULE BY MOUTH TWICE A DAY 60 capsule 2   cloBAZam (ONFI) 10 MG tablet Take 1 tablet (10 mg total) by mouth at bedtime. 90 tablet 5   diazePAM, 15 MG Dose, (VALTOCO 15 MG DOSE) 2 x 7.5 MG/0.1ML LQPK Place 15 mg into the nose as needed (For seizure lasting more than 5 minutes). 2 each 5   lamoTRIgine (LAMICTAL) 100 MG tablet 400 mg AM and 500 PM 810 tablet 3   levETIRAcetam (KEPPRA) 1000 MG tablet Take 1 tablet (1,000 mg total) by mouth 2 (two) times daily. 360 tablet 3   Melatonin 10 MG CAPS Take 10 mg by mouth at bedtime.     Multiple Vitamin (MULTIVITAMIN WITH MINERALS) TABS tablet Take 1 tablet by mouth daily.     No facility-administered medications prior to visit.    PAST MEDICAL HISTORY: Past Medical History:  Diagnosis Date   ADHD (attention deficit hyperactivity disorder)    Autism    MDD (major depressive disorder)    Post traumatic stress disorder (PTSD)    Seasonal allergies     Seizures    Social anxiety disorder of childhood     PAST SURGICAL HISTORY: Past Surgical History:  Procedure Laterality Date   CIRCUMCISION  2002   VAGUS NERVE STIMULATOR INSERTION Left 09/12/2022   Procedure: VAGAL NERVE STIMULATOR IMPLANT;  Surgeon: Lisbeth Renshaw, MD;  Location: [facility] OR;  Service: Neurosurgery;  Laterality: Left;    FAMILY HISTORY: Family History  Problem Relation Age of Onset   Other Mother        Slow Learner   Seizures Mother        Had Seizures Due to Head Trauma   Other Father        Slow Learner   Asthma Sister    Seizures Maternal Grandmother        Generalized Seizures   Heart attack Maternal Grandfather        Died at 19    SOCIAL HISTORY: Social History   Socioeconomic History   Marital status: Single    Spouse name: Not on file   Number of children: Not on file   Years of education: Not on file   Highest education level: Not on file  Occupational History   Not on file  Tobacco Use   Smoking status: Every Day    Types: E-cigarettes   Smokeless tobacco: Current    Types: Snuff  Vaping Use   Vaping Use: Some days  Substance and Sexual Activity   Alcohol use: Yes    Alcohol/week: 1.0 standard drink of alcohol    Types: 1 Cans of beer per week   Drug use: No   Sexual activity: Not Currently  Other Topics Concern   Not on file  Social History Narrative   Zachary [name] is no longer in school.   He lives with his grandmother.   He enjoys working with his hands, mowing, weed eating, drawing, and playing on his phone.   Social Determinants of Health   Financial Resource Strain: Not on file  Food Insecurity: Not on file  Transportation Needs: Not on file  Physical Activity: Not on file  Stress: Not on file  Social Connections: Not on file  Intimate Partner Violence: Not on file     PHYSICAL EXAM  GENERAL EXAM/CONSTITUTIONAL: Vitals:  Vitals:   04/05/23 1140  BP: 129/87  Pulse: 93  Weight: 179 lb 14.3 oz (81.6 kg)   Height:  (1.803 m)     Body mass index is 25.09 kg/m. Wt Readings from Last 3 Encounters:  04/05/23 179 lb 14.3 oz (81.6 kg)  03/01/23 180 lb (81.6 kg)  02/15/23 180 lb 8 oz (81.9 kg)   Patient is in no distress; well developed, nourished and groomed; neck is supple  EYES: Visual fields full to confrontation, Extraocular movements intacts,  No results found.  MUSCULOSKELETAL: Gait, strength, tone, movements noted in Neurologic exam below  NEUROLOGIC: MENTAL STATUS:      No data to display         awake, alert, oriented to person, place and time recent and remote memory intact normal attention and concentration language fluent, comprehension intact, naming intact   CRANIAL NERVE:  2nd, 3rd, 4th, 6th -visual fields full to confrontation, extraocular muscles intact, no nystagmus 5th - facial sensation symmetric 7th - facial strength symmetric 8th - hearing intact 9th - palate elevates symmetrically, uvula midline 11th - shoulder shrug symmetric 12th - tongue protrusion midline  MOTOR:  normal bulk and tone, full strength in the BUE, BLE  SENSORY:  normal and symmetric to light touch  GAIT/STATION:  normal   DIAGNOSTIC DATA (LABS, IMAGING, TESTING) - I reviewed patient records, labs, notes, testing and imaging myself where available.  Lab Results  Component Value Date   WBC 6.2 09/07/2022   HGB 15.9 09/07/2022   HCT 44.8 09/07/2022   MCV 87.5 09/07/2022   PLT 246 09/07/2022      Component Value Date/Time   NA 140 09/07/2022 1600   NA 141 10/05/2021 1511   K 3.9 09/07/2022 1600   CL 106 09/07/2022 1600   CO2 29 09/07/2022 1600   GLUCOSE 92 09/07/2022 1600   BUN 9 09/07/2022 1600   BUN 5 (L) 10/05/2021 1511   CREATININE 1.13 09/07/2022 1600   CALCIUM 9.3 09/07/2022 1600   PROT 8.1 08/10/2021 0133   ALBUMIN 4.7 08/10/2021 0133   AST 24 08/10/2021 0133   ALT 31 08/10/2021 0133   ALKPHOS 98 08/10/2021 0133   BILITOT 0.9 08/10/2021 0133    GFRNONAA >60 09/07/2022 1600   GFRAA >60 06/22/2019 1431   No results found for: "CHOL", "HDL", "LDLCALC", "LDLDIRECT", "TRIG" No results found for: "HGBA1C" No results found for: "VITAMINB12" Lab Results  Component Value Date   TSH 3.194 12/29/2010    Head CT 2012 Normal head CT   EEG 2015 This is a abnormal record with the patient awake.  Is characterized by generalized spike and slow-wave activity and bilateral occipital spike and slow-wave discharges that are epileptogenic from an electrographic viewpoint.  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   ASSESSMENT AND PLAN  22 y.o. year old male  with past medical history of generalized epilepsy, mild intellectual disability, autism spectrum disorder and depression here for follow up.  He is on 3 AED including lamotrigine 400 mg in the morning and 500 mg at night, levetiracetam 1000 mg twice daily and clobazam 10 mg nightly.  He is status post [name] therapy on October 2, tolerated the procedure very well, and he had a breakthrough seizure early April 2024 in the setting of missing his medication, ?poor sleep. Since then he has not had any additional seizures.  Plan for today will be to keep [place] on the  same antiseizure medications and to increase the [name] setting.  I will see him in 2 weeks for additional [name] setting changes. He voices understanding.    1. 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 S/P placement of [name] (vagus nerve stimulation) device      PLAN: Continue with current medications   Keppra 1000 mg twice a day   Lamictal 400 mg in the morning and 500 mg at night   Clobazam 10 mg nightly  Valtoco as rescue medication  [name] setting changes today. Tolerated procedure well  Follow up in 2 weeks for additional [name] setting changes.    Per [facility] statutes, patients with seizures are not allowed to drive until they have been  seizure-free for six months.  Other recommendations include using caution when using heavy equipment or power tools. Avoid working on ladders or at heights. Take showers instead of baths.  Do not swim alone.  Ensure the water temperature is not too high on the home water heater. Do not go swimming alone. Do not lock yourself in a room alone (i.e. bathroom). When caring for infants or small children, sit down when holding, feeding, or changing them to minimize risk of injury to the child in the event you have a seizure. Maintain good sleep hygiene. Avoid alcohol.  Also recommend adequate sleep, hydration, good diet and minimize stress.   During the Seizure  - First, ensure adequate ventilation and place patients on the floor on their left side  Loosen clothing around the neck and ensure the airway is patent. If the patient is clenching the teeth, do not force the mouth open with any object as this can cause severe damage - Remove all items from the surrounding that can be hazardous. The patient may be oblivious to what's happening and may not even know what he or she is doing. If the patient is confused and wandering, either gently guide him/her away and block access to outside areas - Reassure the individual and be comforting - Call 911. In most cases, the seizure ends before EMS arrives. However, there are cases when seizures may last over 3 to 5 minutes. Or the individual may have developed breathing difficulties or severe injuries. If a pregnant patient or a person with diabetes develops a seizure, it is prudent to call an ambulance. - Finally, if the patient does not regain full consciousness, then call EMS. Most patients will remain confused for about 45 to 90 minutes after a seizure, so you must use judgment in calling for help. - Avoid restraints but make sure the patient is in a bed with padded side rails - Place the individual in a lateral position with the neck slightly flexed; this will help  the saliva drain from the mouth and prevent the tongue from falling backward - Remove all nearby furniture and other hazards from the area - Provide verbal assurance as the individual is regaining consciousness - Provide the patient with privacy if possible - Call for help and start treatment as ordered by the caregiver   After the Seizure (Postictal Stage)  After a seizure, most patients experience confusion, fatigue, muscle pain and/or a headache. Thus, one should permit the individual to sleep. For the next few days, reassurance is essential. Being calm and helping reorient the person is also of importance.  Most seizures are painless and end spontaneously. Seizures are not harmful to others but can lead to complications such as stress on the lungs,  brain and the heart. Individuals with prior lung problems may develop labored breathing and respiratory distress.     Orders Placed This Encounter  Procedures   Vagal Nerve Stimulation    No orders of the defined types were placed in this encounter.   No follow-ups on file.     Windell Norfolk, MD 04/05/2023, 1:45 PM  [facility] [address], [address] [place], [place] [address] [phone]

## 2023-04-06 ENCOUNTER — Telehealth: Payer: Self-pay | Admitting: Neurology

## 2023-04-06 ENCOUNTER — Other Ambulatory Visit: Payer: Self-pay | Admitting: Neurology

## 2023-04-06 MED ORDER — CLOBAZAM 20 MG PO TABS: 20.0000 mg | ORAL_TABLET | Freq: Every evening | ORAL | 5 refills | Status: DC

## 2023-04-06 NOTE — Telephone Encounter (Signed)
Late entry: I talked to [name], yesterday on 04/05/2023, around 6:15 PM.  She had contacted the after-hours call service with a message that he had fallen and had 2 seizures.  I spoke to the patient's grandmother at length.  She reported that they had just seen Dr. Teresa Coombs and an adjustment was done to the [name].  Patient had been outside with his cousin, he fell to the ground, hit his head, he was wearing a helmet.  He had 2 seizures.  By the time she called, he was no longer seizing, he apparently was back to baseline.  However, he had some bleeding from the mouth but no obvious head laceration.  She reported that mouth bleeding was not uncommon for him when having a seizure.  She was advised to have him go to the emergency room by calling 911 or take him to the emergency room herself as he fell outside and hit his head and he was bleeding from the mouth.  I could overhear her talk to him.  He declined going to the ER.  She reported that since she was 21 years old she could not make him go to the emergency room.  She verbalized understanding of my recommendation to get him checked out for head injury.  She indicated that she would observe him.  She requested that Dr. Teresa Coombs be notified.  I advised her that I would send a note to Dr. Teresa Coombs.

## 2023-04-06 NOTE — Telephone Encounter (Signed)
Called and relayed msg from Dr. Teresa Coombs to double the  of clobazam until she picks up  and she voiced understanding.

## 2023-04-06 NOTE — Telephone Encounter (Signed)
Please call Grandmother and inform her that I have increase Shankar Clobazam to 20 mg at night. Please have her give Keevin 2 tablets of the 10 mg until she picks up the new medications. I tried calling the first number listed (not working) and the second number (unable to leave a message).   Dr. Teresa Coombs

## 2023-04-19 ENCOUNTER — Encounter: Payer: Self-pay | Admitting: Neurology

## 2023-04-19 ENCOUNTER — Ambulatory Visit: Payer: Medicaid Other | Admitting: Neurology

## 2023-04-19 VITALS — BP 126/74 | Ht 70.0 in | Wt 185.0 lb

## 2023-04-19 DIAGNOSIS — G40319 Generalized idiopathic epilepsy and epileptic syndromes, intractable, without status epilepticus: Secondary | ICD-10-CM | POA: Diagnosis not present

## 2023-04-19 DIAGNOSIS — Z9689 Presence of other specified functional implants: Secondary | ICD-10-CM

## 2023-04-19 NOTE — Patient Instructions (Signed)
Continue current medications  [name] setting changed today, tolerated procedure well  Follow up in a couple weeks for additional [name] setting changes

## 2023-04-19 NOTE — Progress Notes (Signed)
[facility]  PATIENT: Zachary [name] DOB: [birth year]  REFERRING CLINICIAN: Corrington, Kip A, MD HISTORY FROM: Patient and Grandmother  REASON FOR VISIT: Seizures    HISTORICAL  CHIEF COMPLAINT:  Chief Complaint  Patient presents with   Follow-up    Rm 15, with grandma, [name] follow up, states he has been swiping daily, no seizures since last visit per grandma   INTERVAL HISTORY 04/19/2023:  Zachary [name] presents today for follow-up, he is accompanied by grandmother.  Last visit was on April 24.  Since then, he has not had any seizure or seizure-like activity.  He is compliant with his medications and also swipes his [name] at least 4 times daily.  No other complaints, no other concerns.  He said yesterday he went and ride his motorbike and had sunburn in his hands otherwise he has been doing well.   INTERVAL HISTORY 04/05/2023:  Zachary [name] presents today for follow-up, he is accompanied by grandmother.  Last visit was on March 20 at that time we had increased his [name] settings and he tolerated the procedure very well.  States that he has been doing well until April 3 when he had a breakthrough seizure.  Per grandmother he had a cluster of 3 seizures while fishing.  He did fall to the water but luckily his cousin was there and got him out of the water.  There is a possibility that he had missed his medication because he has run out.  Since then he has been doing well.  No additional seizures.  He continued to swipe his [name] and no no side effect.   INTERVAL HISTORY 03/01/2023:  Zachary [name] presents today for follow-up, he is accompanied by grandmother.  Last visit was on March 6.  Since then he has not had any seizure or seizure-like activity.  His [name] setting was changed at that time and he tolerated procedure well.  He reports swiping his [name] at least 2-3 times per day.  Overall tolerating the [name] changes very well, no seizure, no concerns and no other complaints.   INTERVAL HISTORY  02/15/2023:  Zachary [name] present today for follow-up, he is accompanied by mother and cousin.  Last visit was on February 22.  His [name] setting at that time was increased.  He tolerated procedure very well, denies any side effect.  Again denies any side effect from the seizure medication and no seizures since last visit.  Overall he is stable.  He is ready for another [name] setting changes.   INTERVAL HISTORY 02/02/2023 Zachary [name] presents today for follow-up.  He is accompanied by grandmother.  Last visit was in November 29.  He missed a couple appointments.  He reported having a breakthrough seizure 2 weeks ago.  Grandmother reports the seizure being a generalized tonic-clonic seizure, she did not use the Valtoco as rescue medication.  He might have made mistake regarding his antiseizure medications, Keppra and the lamotrigine but unsure which mistake he made because both of his medication are twice a day and he felt like he took his medications twice that day.  Unclear but he still had a seizure. Since then he has not had any additional seizures.  [name] was turned on at last visit, he has tolerated well.    INTERVAL HISTORY 11/09/22:  Zachary [name] presents today for follow-up, he is accompanied by both grandmother and father.  Last visit was on October 23, since then he has not had any seizures.  He is tolerating the [name] very well.  Denies  any excessive coughing, denies any hoarseness of voice.  Overall no complaints doing well.   INTERVAL HISTORY 10/03/22:  Patient presents today for follow-up, he is accompanied by grandmother.  He has a [name] placed on October 2.  Since then he has not had any seizure or seizure-like activity.  Overall doing well.  Tolerated the procedure very well.   INTERVAL HISTORY 07/12/22:  Zachary [name] presents today with grandmother. At last visit, plan was to start Cenobamate, after taking the medication for about  week, he has worsening behavior, increase aggression and the medication was discontinue  and his behavior is back to normal. He denies any additional seizure since last visit. Currently he is on Keppra, Lamotrigine and Clobazam.  He has not completed the EEG yet.    INTERVAL HISTORY 05/23/2022:  Zachary [name] presents today for follow-up.  He is accompanied by his grandmother.  Last visit was in October, at that time I started him on clobazam.  Grandmother reports since starting the clobazam he has been doing well actually, was seizure-free up for about 3 months after that.  But seizure return at 2 months ago during this time also Zachary [name] was noted to start using kratom. Grandmother was not aware of it until later when Zachary [name] told him about the Kratom.  He reported that yesterday he had 2 strong generalized convulsions back-to-back each lasting about 3 minutes.  She did not take him to the emergency department.  Zachary [name] reported he has not taken the kratom for about 2 weeks.  He is compliant with his current medications.  He continues to work.  Currently he is on clobazam 10 mg nightly lamotrigine 400 mg in the morning and 500 mg at night levetiracetam 1000 mg twice daily.   HISTORY OF PRESENT ILLNESS:  This is a 22 year old gentleman with past medical history of generalized seizure, autism spectrum disorder and depression who is presenting for management of his epilepsy.  Patient was previously under the care of Dr. Sharene Skeans for his seizure disorder.  Per chart review he has his first seizure in 2012 described as generalized tonic-clonic, at that time he had a EEG that showed high amplitude generalized spikes, polyspike or slow wave discharges that occur singly at 1 to 2 seconds of cluster.  He did have a second seizure in March 2013 while traveling in car with his father there was flickering of some light to the trees.  He began to blink, stare and then has a generalized tonic-clonic seizure.  At that time he had a head CT which was normal.  He was initially started on Depakote but did not tolerate due  to agitation. Currently he is on Keppra 1000 mg twice daily and lamotrigine 400 mg in the morning and 500 mg at night.  He is also on Klonopin 1.25 mg at bedtime.  Grandmother reports that the last seizure occurred 24-month ago while he was attending a car race.  He reported there was light on the track but the cars themselves did not have any headlights.  Prior to that his previous seizure was a couple weeks before.  They do not have a rescue medication.   Handedness: Right handed   Seizure Type: Focal seizures and generalized seizures  Current frequency: Last seizure 2 months   Any injuries from seizures: None   Seizure risk factors: Autism,   Previous ASMs: Depakote, Klonopin  Currenty ASMs: Lamotrigine 400 mg AM and 500 mg PM, Levetiracetam 1000 mg BID and Clobazam 10 mg at bedtime  ASMs side effects: None   Brain Images: normal Heat CT   Previous EEGs: High amplitude generalized spike polyspike and slow wave   OTHER MEDICAL CONDITIONS: Generalized epilepsy, ADHD, autism, depression  REVIEW OF SYSTEMS: Full 14 system review of systems performed and negative with exception of: As noted in the HPI  ALLERGIES: Allergies  Allergen Reactions   Strawberry Extract Rash    HOME MEDICATIONS: Outpatient Medications Prior to Visit  Medication Sig Dispense Refill   acetaminophen (TYLENOL) 500 MG tablet Take 500 mg by mouth every 6 (six) hours as needed for mild pain.     amantadine (SYMMETREL) 100 MG capsule TAKE 1 CAPSULE BY MOUTH TWICE A DAY 60 capsule 2   cloBAZam (ONFI) 20 MG tablet Take 1 tablet (20 mg total) by mouth at bedtime. 90 tablet 5   diazePAM, 15 MG Dose, (VALTOCO 15 MG DOSE) 2 x 7.5 MG/0.1ML LQPK Place 15 mg into the nose as needed (For seizure lasting more than 5 minutes). 2 each 5   lamoTRIgine (LAMICTAL) 100 MG tablet 400 mg AM and 500 PM 810 tablet 3   levETIRAcetam (KEPPRA) 1000 MG tablet Take 1 tablet (1,000 mg total) by mouth 2 (two) times daily. 360 tablet 3    Melatonin 10 MG CAPS Take 10 mg by mouth at bedtime.     Multiple Vitamin (MULTIVITAMIN WITH MINERALS) TABS tablet Take 1 tablet by mouth daily.     No facility-administered medications prior to visit.    PAST MEDICAL HISTORY: Past Medical History:  Diagnosis Date   ADHD (attention deficit hyperactivity disorder)    Autism    MDD (major depressive disorder)    Post traumatic stress disorder (PTSD)    Seasonal allergies    Seizures (HCC)    Social anxiety disorder of childhood     PAST SURGICAL HISTORY: Past Surgical History:  Procedure Laterality Date   CIRCUMCISION  2002   VAGUS NERVE STIMULATOR INSERTION Left 09/12/2022   Procedure: VAGAL NERVE STIMULATOR IMPLANT;  Surgeon: Lisbeth Renshaw, MD;  Location: [facility] OR;  Service: Neurosurgery;  Laterality: Left;    FAMILY HISTORY: Family History  Problem Relation Age of Onset   Other Mother        Slow Learner   Seizures Mother        Had Seizures Due to Head Trauma   Other Father        Slow Learner   Asthma Sister    Seizures Maternal Grandmother        Generalized Seizures   Heart attack Maternal Grandfather        Died at 106    SOCIAL HISTORY: Social History   Socioeconomic History   Marital status: Single    Spouse name: Not on file   Number of children: Not on file   Years of education: Not on file   Highest education level: Not on file  Occupational History   Not on file  Tobacco Use   Smoking status: Every Day    Types: E-cigarettes   Smokeless tobacco: Current    Types: Snuff  Vaping Use   Vaping Use: Some days  Substance and Sexual Activity   Alcohol use: Yes    Alcohol/week: 1.0 standard drink of alcohol    Types: 1 Cans of beer per week   Drug use: No   Sexual activity: Not Currently  Other Topics Concern   Not on file  Social History Narrative   Spiros is no longer in school.  He lives with his grandmother.   He enjoys working with his hands, mowing, weed eating, drawing, and playing  on his phone.   Social Determinants of Health   Financial Resource Strain: Not on file  Food Insecurity: Not on file  Transportation Needs: Not on file  Physical Activity: Not on file  Stress: Not on file  Social Connections: Not on file  Intimate Partner Violence: Not on file     PHYSICAL EXAM  GENERAL EXAM/CONSTITUTIONAL: Vitals:  Vitals:   04/19/23 1149  BP: 126/74  Weight: 185 lb (83.9 kg)  Height: 5\' 10"  (1.778 m)     Body mass index is 26.54 kg/m. Wt Readings from Last 3 Encounters:  04/19/23 185 lb (83.9 kg)  04/05/23 179 lb 14.3 oz (81.6 kg)  03/01/23 180 lb (81.6 kg)   Patient is in no distress; well developed, nourished and groomed; neck is supple  MUSCULOSKELETAL: Gait, strength, tone, movements noted in Neurologic exam below  NEUROLOGIC: MENTAL STATUS:      No data to display         awake, alert, oriented to person, place and time recent and remote memory intact normal attention and concentration language fluent, comprehension intact, naming intact   CRANIAL NERVE:  2nd, 3rd, 4th, 6th -visual fields full to confrontation, extraocular muscles intact, no nystagmus 5th - facial sensation symmetric 7th - facial strength symmetric 8th - hearing intact 9th - palate elevates symmetrically, uvula midline 11th - shoulder shrug symmetric 12th - tongue protrusion midline  MOTOR:  normal bulk and tone, full strength in the BUE, BLE  SENSORY:  normal and symmetric to light touch  GAIT/STATION:  normal   DIAGNOSTIC DATA (LABS, IMAGING, TESTING) - I reviewed patient records, labs, notes, testing and imaging myself where available.  Lab Results  Component Value Date   WBC 6.2 09/07/2022   HGB 15.9 09/07/2022   HCT 44.8 09/07/2022   MCV 87.5 09/07/2022   PLT 246 09/07/2022      Component Value Date/Time   NA 140 09/07/2022 1600   NA 141 10/05/2021 1511   K 3.9 09/07/2022 1600   CL 106 09/07/2022 1600   CO2 29 09/07/2022 1600    GLUCOSE 92 09/07/2022 1600   BUN 9 09/07/2022 1600   BUN 5 (L) 10/05/2021 1511   CREATININE 1.13 09/07/2022 1600   CALCIUM 9.3 09/07/2022 1600   PROT 8.1 08/10/2021 0133   ALBUMIN 4.7 08/10/2021 0133   AST 24 08/10/2021 0133   ALT 31 08/10/2021 0133   ALKPHOS 98 08/10/2021 0133   BILITOT 0.9 08/10/2021 0133   GFRNONAA >60 09/07/2022 1600   GFRAA >60 06/22/2019 1431   No results found for: "CHOL", "HDL", "LDLCALC", "LDLDIRECT", "TRIG" No results found for: "HGBA1C" No results found for: "VITAMINB12" Lab Results  Component Value Date   TSH 3.194 12/29/2010    Head CT 2012 Normal head CT   EEG 2015 This is a abnormal record with the patient awake.  Is characterized by generalized spike and slow-wave activity and bilateral occipital spike and slow-wave discharges that are epileptogenic from an electrographic viewpoint.  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   ASSESSMENT AND PLAN  22 y.o. year old male  with past medical history of generalized epilepsy, mild intellectual disability, autism spectrum disorder and depression here for follow up.  He is on 3 AED including lamotrigine 400 mg in the morning and 500 mg at night, levetiracetam 1000 mg twice daily  and clobazam 10 mg nightly.  He is status post [name] therapy on October 2, tolerated the procedure very well, and he had a breakthrough seizure early April 2024 in the setting of missing his medication, ?poor sleep. Since then he has not had any additional seizures.  Plan for today will be to keep Zachary [name] on the same antiseizure medications and to increase the [name] setting.  I will see him in 2 weeks for additional [name] setting changes. He voices understanding.    1. 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 S/P placement of [name] (vagus nerve stimulation) device      PLAN: Continue with current medications   Keppra 1000 mg twice a day   Lamictal 400 mg  in the morning and 500 mg at night   Clobazam 10 mg nightly  Valtoco as rescue medication  [name] setting changes today. Tolerated procedure well  Follow up in 2 weeks for additional [name] setting changes.    Per [facility] statutes, patients with seizures are not allowed to drive until they have been seizure-free for six months.  Other recommendations include using caution when using heavy equipment or power tools. Avoid working on ladders or at heights. Take showers instead of baths.  Do not swim alone.  Ensure the water temperature is not too high on the home water heater. Do not go swimming alone. Do not lock yourself in a room alone (i.e. bathroom). When caring for infants or small children, sit down when holding, feeding, or changing them to minimize risk of injury to the child in the event you have a seizure. Maintain good sleep hygiene. Avoid alcohol.  Also recommend adequate sleep, hydration, good diet and minimize stress.   During the Seizure  - First, ensure adequate ventilation and place patients on the floor on their left side  Loosen clothing around the neck and ensure the airway is patent. If the patient is clenching the teeth, do not force the mouth open with any object as this can cause severe damage - Remove all items from the surrounding that can be hazardous. The patient may be oblivious to what's happening and may not even know what he or she is doing. If the patient is confused and wandering, either gently guide him/her away and block access to outside areas - Reassure the individual and be comforting - Call 911. In most cases, the seizure ends before EMS arrives. However, there are cases when seizures may last over 3 to 5 minutes. Or the individual may have developed breathing difficulties or severe injuries. If a pregnant patient or a person with diabetes develops a seizure, it is prudent to call an ambulance. - Finally, if the patient does not regain full consciousness,  then call EMS. Most patients will remain confused for about 45 to 90 minutes after a seizure, so you must use judgment in calling for help. - Avoid restraints but make sure the patient is in a bed with padded side rails - Place the individual in a lateral position with the neck slightly flexed; this will help the saliva drain from the mouth and prevent the tongue from falling backward - Remove all nearby furniture and other hazards from the area - Provide verbal assurance as the individual is regaining consciousness - Provide the patient with privacy if possible - Call for help and start treatment as ordered by the caregiver   After the Seizure (Postictal Stage)  After a seizure, most patients experience  confusion, fatigue, muscle pain and/or a headache. Thus, one should permit the individual to sleep. For the next few days, reassurance is essential. Being calm and helping reorient the person is also of importance.  Most seizures are painless and end spontaneously. Seizures are not harmful to others but can lead to complications such as stress on the lungs, brain and the heart. Individuals with prior lung problems may develop labored breathing and respiratory distress.     Orders Placed This Encounter  Procedures   Vagal Nerve Stimulation    No orders of the defined types were placed in this encounter.   No follow-ups on file.     Windell Norfolk, MD 04/19/2023, 3:16 PM  [facility] [address] [place], [place] [address] [phone]

## 2023-05-03 ENCOUNTER — Ambulatory Visit (INDEPENDENT_AMBULATORY_CARE_PROVIDER_SITE_OTHER): Payer: Medicaid Other | Admitting: Neurology

## 2023-05-03 ENCOUNTER — Encounter: Payer: Self-pay | Admitting: Neurology

## 2023-05-03 VITALS — BP 139/86 | HR 73 | Ht 70.0 in | Wt 184.0 lb

## 2023-05-03 DIAGNOSIS — Z9689 Presence of other specified functional implants: Secondary | ICD-10-CM | POA: Diagnosis not present

## 2023-05-03 DIAGNOSIS — G40319 Generalized idiopathic epilepsy and epileptic syndromes, intractable, without status epilepticus: Secondary | ICD-10-CM

## 2023-05-03 NOTE — Progress Notes (Signed)
[facility]  PATIENT: Zachary [name] DOB: 2001/08/14  REFERRING CLINICIAN: Corrington, Kip A, MD HISTORY FROM: Patient and Grandmother  REASON FOR VISIT: Seizures    HISTORICAL  CHIEF COMPLAINT:  Chief Complaint  Patient presents with   Follow-up    Rm 13. Patient with grandma, Patient states nothing has changed.    INTERVAL HISTORY 05/03/2023:  Zachary [name] presents today for follow-up, he is accompanied by grandmother.  Last visit was On May 8 and since then he has not had any seizure or seizure-like activity.  He continues to tolerate the [name] settings very well.  No other complaint or concerns.   INTERVAL HISTORY 04/19/2023:  Zachary [name] presents today for follow-up, he is accompanied by grandmother.  Last visit was on April 24.  Since then, he has not had any seizure or seizure-like activity.  He is compliant with his medications and also swipes his [name] at least 4 times daily.  No other complaints, no other concerns.  He said yesterday he went and ride his motorbike and had sunburn in his hands otherwise he has been doing well.   INTERVAL HISTORY 04/05/2023:  Zachary [name] presents today for follow-up, he is accompanied by grandmother.  Last visit was on March 20 at that time we had increased his [name] settings and he tolerated the procedure very well.  States that he has been doing well until April 3 when he had a breakthrough seizure.  Per grandmother he had a cluster of 3 seizures while fishing.  He did fall to the water but luckily his cousin was there and got him out of the water.  There is a possibility that he had missed his medication because he has run out.  Since then he has been doing well.  No additional seizures.  He continued to swipe his [name] and no no side effect.   INTERVAL HISTORY 03/01/2023:  Zachary [name] presents today for follow-up, he is accompanied by grandmother.  Last visit was on March 6.  Since then he has not had any seizure or seizure-like activity.  His [name]  setting was changed at that time and he tolerated procedure well.  He reports swiping his [name] at least 2-3 times per day.  Overall tolerating the [name] changes very well, no seizure, no concerns and no other complaints.   INTERVAL HISTORY 02/15/2023:  Zachary [name] present today for follow-up, he is accompanied by mother and cousin.  Last visit was on February 22.  His [name] setting at that time was increased.  He tolerated procedure very well, denies any side effect.  Again denies any side effect from the seizure medication and no seizures since last visit.  Overall he is stable.  He is ready for another [name] setting changes.   INTERVAL HISTORY 02/02/2023 Zachary [name] presents today for follow-up.  He is accompanied by grandmother.  Last visit was in November 29.  He missed a couple appointments.  He reported having a breakthrough seizure 2 weeks ago.  Grandmother reports the seizure being a generalized tonic-clonic seizure, she did not use the Valtoco as rescue medication.  He might have made mistake regarding his antiseizure medications, Keppra and the lamotrigine but unsure which mistake he made because both of his medication are twice a day and he felt like he took his medications twice that day.  Unclear but he still had a seizure. Since then he has not had any additional seizures.  [name] was turned on at last visit, he has tolerated well.  INTERVAL HISTORY 11/09/22:  Zachary [name] presents today for follow-up, he is accompanied by both grandmother and father.  Last visit was on October 23, since then he has not had any seizures.  He is tolerating the [name] very well.  Denies any excessive coughing, denies any hoarseness of voice.  Overall no complaints doing well.   INTERVAL HISTORY 10/03/22:  Patient presents today for follow-up, he is accompanied by grandmother.  He has a [name] placed on October 2.  Since then he has not had any seizure or seizure-like activity.  Overall doing well.  Tolerated the procedure very  well.   INTERVAL HISTORY 07/12/22:  Zachary [name] presents today with grandmother. At last visit, plan was to start Cenobamate, after taking the medication for about  week, he has worsening behavior, increase aggression and the medication was discontinue and his behavior is back to normal. He denies any additional seizure since last visit. Currently he is on Keppra, Lamotrigine and Clobazam.  He has not completed the EEG yet.    INTERVAL HISTORY 05/23/2022:  Zachary [name] presents today for follow-up.  He is accompanied by his grandmother.  Last visit was in October, at that time I started him on clobazam.  Grandmother reports since starting the clobazam he has been doing well actually, was seizure-free up for about 3 months after that.  But seizure return at 2 months ago during this time also Zachary [name] was noted to start using kratom. Grandmother was not aware of it until later when Boss told him about the Kratom.  He reported that yesterday he had 2 strong generalized convulsions back-to-back each lasting about 3 minutes.  She did not take him to the emergency department.  Zachary [name] reported he has not taken the kratom for about 2 weeks.  He is compliant with his current medications.  He continues to work.  Currently he is on clobazam 10 mg nightly lamotrigine 400 mg in the morning and 500 mg at night levetiracetam 1000 mg twice daily.   HISTORY OF PRESENT ILLNESS:  This is a 22 year old gentleman with past medical history of generalized seizure, autism spectrum disorder and depression who is presenting for management of his epilepsy.  Patient was previously under the care of Dr. Sharene Skeans for his seizure disorder.  Per chart review he has his first seizure in 2012 described as generalized tonic-clonic, at that time he had a EEG that showed high amplitude generalized spikes, polyspike or slow wave discharges that occur singly at 1 to 2 seconds of cluster.  He did have a second seizure in March 2013 while traveling in car  with his father there was flickering of some light to the trees.  He began to blink, stare and then has a generalized tonic-clonic seizure.  At that time he had a head CT which was normal.  He was initially started on Depakote but did not tolerate due to agitation. Currently he is on Keppra 1000 mg twice daily and lamotrigine 400 mg in the morning and 500 mg at night.  He is also on Klonopin 1.25 mg at bedtime.  Grandmother reports that the last seizure occurred 13-month ago while he was attending a car race.  He reported there was light on the track but the cars themselves did not have any headlights.  Prior to that his previous seizure was a couple weeks before.  They do not have a rescue medication.   Handedness: Right handed   Seizure Type: Focal seizures and generalized seizures  Current frequency: Last seizure  2 months   Any injuries from seizures: None   Seizure risk factors: Autism,   Previous ASMs: Depakote, Klonopin  Currenty ASMs: Lamotrigine 400 mg AM and 500 mg PM, Levetiracetam 1000 mg BID and Clobazam 10 mg at bedtime   ASMs side effects: None   Brain Images: normal Heat CT   Previous EEGs: High amplitude generalized spike polyspike and slow wave   OTHER MEDICAL CONDITIONS: Generalized epilepsy, ADHD, autism, depression  REVIEW OF SYSTEMS: Full 14 system review of systems performed and negative with exception of: As noted in the HPI  ALLERGIES: Allergies  Allergen Reactions   Strawberry Extract Rash    HOME MEDICATIONS: Outpatient Medications Prior to Visit  Medication Sig Dispense Refill   acetaminophen (TYLENOL) 500 MG tablet Take 500 mg by mouth every 6 (six) hours as needed for mild pain.     amantadine (SYMMETREL) 100 MG capsule TAKE 1 CAPSULE BY MOUTH TWICE A DAY 60 capsule 2   cloBAZam (ONFI) 20 MG tablet Take 1 tablet (20 mg total) by mouth at bedtime. 90 tablet 5   diazePAM, 15 MG Dose, (VALTOCO 15 MG DOSE) 2 x 7.5 MG/0.1ML LQPK Place 15 mg into the  nose as needed (For seizure lasting more than 5 minutes). 2 each 5   lamoTRIgine (LAMICTAL) 100 MG tablet 400 mg AM and 500 PM 810 tablet 3   levETIRAcetam (KEPPRA) 1000 MG tablet Take 1 tablet (1,000 mg total) by mouth 2 (two) times daily. 360 tablet 3   Melatonin 10 MG CAPS Take 10 mg by mouth at bedtime.     Multiple Vitamin (MULTIVITAMIN WITH MINERALS) TABS tablet Take 1 tablet by mouth daily.     No facility-administered medications prior to visit.    PAST MEDICAL HISTORY: Past Medical History:  Diagnosis Date   ADHD (attention deficit hyperactivity disorder)    Autism    MDD (major depressive disorder)    Post traumatic stress disorder (PTSD)    Seasonal allergies    Seizures (HCC)    Social anxiety disorder of childhood     PAST SURGICAL HISTORY: Past Surgical History:  Procedure Laterality Date   CIRCUMCISION  2002   VAGUS NERVE STIMULATOR INSERTION Left 09/12/2022   Procedure: VAGAL NERVE STIMULATOR IMPLANT;  Surgeon: Lisbeth Renshaw, MD;  Location: [facility] OR;  Service: Neurosurgery;  Laterality: Left;    FAMILY HISTORY: Family History  Problem Relation Age of Onset   Other Mother        Slow Learner   Seizures Mother        Had Seizures Due to Head Trauma   Other Father        Slow Learner   Asthma Sister    Seizures Maternal Grandmother        Generalized Seizures   Heart attack Maternal Grandfather        Died at 73    SOCIAL HISTORY: Social History   Socioeconomic History   Marital status: Single    Spouse name: Not on file   Number of children: Not on file   Years of education: Not on file   Highest education level: Not on file  Occupational History   Not on file  Tobacco Use   Smoking status: Every Day    Types: E-cigarettes   Smokeless tobacco: Current    Types: Snuff  Vaping Use   Vaping Use: Some days  Substance and Sexual Activity   Alcohol use: Yes    Alcohol/week: 1.0 standard drink of  alcohol    Types: 1 Cans of beer per week    Drug use: No   Sexual activity: Not Currently  Other Topics Concern   Not on file  Social History Narrative   Dalyn is no longer in school.   He lives with his grandmother.   He enjoys working with his hands, mowing, weed eating, drawing, and playing on his phone.   Social Determinants of Health   Financial Resource Strain: Not on file  Food Insecurity: Not on file  Transportation Needs: Not on file  Physical Activity: Not on file  Stress: Not on file  Social Connections: Not on file  Intimate Partner Violence: Not on file     PHYSICAL EXAM  GENERAL EXAM/CONSTITUTIONAL: Vitals:  Vitals:   05/03/23 1155  BP: 139/86  Pulse: 73  Weight: 184 lb (83.5 kg)  Height: 5\' 10"  (1.778 m)     Body mass index is 26.4 kg/m. Wt Readings from Last 3 Encounters:  05/03/23 184 lb (83.5 kg)  04/19/23 185 lb (83.9 kg)  04/05/23 179 lb 14.3 oz (81.6 kg)   Patient is in no distress; well developed, nourished and groomed; neck is supple  MUSCULOSKELETAL: Gait, strength, tone, movements noted in Neurologic exam below  NEUROLOGIC: MENTAL STATUS:      No data to display         awake, alert, oriented to person, place and time recent and remote memory intact normal attention and concentration language fluent, comprehension intact, naming intact   CRANIAL NERVE:  2nd, 3rd, 4th, 6th -visual fields full to confrontation, extraocular muscles intact, no nystagmus 5th - facial sensation symmetric 7th - facial strength symmetric 8th - hearing intact 9th - palate elevates symmetrically, uvula midline 11th - shoulder shrug symmetric 12th - tongue protrusion midline  MOTOR:  normal bulk and tone, full strength in the BUE, BLE  SENSORY:  normal and symmetric to light touch  GAIT/STATION:  normal   DIAGNOSTIC DATA (LABS, IMAGING, TESTING) - I reviewed patient records, labs, notes, testing and imaging myself where available.  Lab Results  Component Value Date   WBC 6.2  09/07/2022   HGB 15.9 09/07/2022   HCT 44.8 09/07/2022   MCV 87.5 09/07/2022   PLT 246 09/07/2022      Component Value Date/Time   NA 140 09/07/2022 1600   NA 141 10/05/2021 1511   K 3.9 09/07/2022 1600   CL 106 09/07/2022 1600   CO2 29 09/07/2022 1600   GLUCOSE 92 09/07/2022 1600   BUN 9 09/07/2022 1600   BUN 5 (L) 10/05/2021 1511   CREATININE 1.13 09/07/2022 1600   CALCIUM 9.3 09/07/2022 1600   PROT 8.1 08/10/2021 0133   ALBUMIN 4.7 08/10/2021 0133   AST 24 08/10/2021 0133   ALT 31 08/10/2021 0133   ALKPHOS 98 08/10/2021 0133   BILITOT 0.9 08/10/2021 0133   GFRNONAA >60 09/07/2022 1600   GFRAA >60 06/22/2019 1431   No results found for: "CHOL", "HDL", "LDLCALC", "LDLDIRECT", "TRIG" No results found for: "HGBA1C" No results found for: "VITAMINB12" Lab Results  Component Value Date   TSH 3.194 12/29/2010    Head CT 2012 Normal head CT   EEG 2015 This is a abnormal record with the patient awake.  Is characterized by generalized spike and slow-wave activity and bilateral occipital spike and slow-wave discharges that are epileptogenic from an electrographic viewpoint.  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   ASSESSMENT AND PLAN  22 y.o. year old male  with past medical history of generalized epilepsy, mild intellectual disability, autism spectrum disorder and depression here for follow up.  He is on 3 AED including lamotrigine 400 mg in the morning and 500 mg at night, levetiracetam 1000 mg twice daily and clobazam 10 mg nightly.  He is status post [name] therapy on October 2, tolerated the procedure very well, and he had a breakthrough seizure early April 2024 in the setting of missing his medication, ?poor sleep. Since then he has not had any additional seizures.  Plan for today will be to keep Bonaparte on the same antiseizure medications and to increase the [name] setting.  We have reach his therapeutic dose of 1.75 mA.  Advised him to  continue swiping his [name] at least 4 times daily and I will see him in 3 months for follow-up.  Return sooner if worse.   1. 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 S/P placement of [name] (vagus nerve stimulation) device      PLAN: Continue with current medications   Keppra 1000 mg twice a day   Lamictal 400 mg in the morning and 500 mg at night   Clobazam 10 mg nightly  Valtoco as rescue medication  [name] setting changes today. Tolerated procedure well  Follow up in 3 months or sooner if worse    Per [facility] statutes, patients with seizures are not allowed to drive until they have been seizure-free for six months.  Other recommendations include using caution when using heavy equipment or power tools. Avoid working on ladders or at heights. Take showers instead of baths.  Do not swim alone.  Ensure the water temperature is not too high on the home water heater. Do not go swimming alone. Do not lock yourself in a room alone (i.e. bathroom). When caring for infants or small children, sit down when holding, feeding, or changing them to minimize risk of injury to the child in the event you have a seizure. Maintain good sleep hygiene. Avoid alcohol.  Also recommend adequate sleep, hydration, good diet and minimize stress.   During the Seizure  - First, ensure adequate ventilation and place patients on the floor on their left side  Loosen clothing around the neck and ensure the airway is patent. If the patient is clenching the teeth, do not force the mouth open with any object as this can cause severe damage - Remove all items from the surrounding that can be hazardous. The patient may be oblivious to what's happening and may not even know what he or she is doing. If the patient is confused and wandering, either gently guide him/her away and block access to outside areas - Reassure the individual and be comforting - Call 911. In most cases, the  seizure ends before EMS arrives. However, there are cases when seizures may last over 3 to 5 minutes. Or the individual may have developed breathing difficulties or severe injuries. If a pregnant patient or a person with diabetes develops a seizure, it is prudent to call an ambulance. - Finally, if the patient does not regain full consciousness, then call EMS. Most patients will remain confused for about 45 to 90 minutes after a seizure, so you must use judgment in calling for help. - Avoid restraints but make sure the patient is in a bed with padded side rails - Place the individual in a lateral position with the neck slightly flexed; this will help  the saliva drain from the mouth and prevent the tongue from falling backward - Remove all nearby furniture and other hazards from the area - Provide verbal assurance as the individual is regaining consciousness - Provide the patient with privacy if possible - Call for help and start treatment as ordered by the caregiver   After the Seizure (Postictal Stage)  After a seizure, most patients experience confusion, fatigue, muscle pain and/or a headache. Thus, one should permit the individual to sleep. For the next few days, reassurance is essential. Being calm and helping reorient the person is also of importance.  Most seizures are painless and end spontaneously. Seizures are not harmful to others but can lead to complications such as stress on the lungs, brain and the heart. Individuals with prior lung problems may develop labored breathing and respiratory distress.     Orders Placed This Encounter  Procedures   Vagal Nerve Stimulation    No orders of the defined types were placed in this encounter.   Return in about 3 months (around 08/01/2023).     Windell Norfolk, MD 05/03/2023, 12:44 PM  [facility] [address], [address] [place], [place] [address] [phone]

## 2023-05-24 ENCOUNTER — Other Ambulatory Visit: Payer: Self-pay | Admitting: Neurology

## 2023-05-24 DIAGNOSIS — G40309 Generalized idiopathic epilepsy and epileptic syndromes, not intractable, without status epilepticus: Secondary | ICD-10-CM

## 2023-05-24 DIAGNOSIS — G40209 Localization-related (focal) (partial) symptomatic epilepsy and epileptic syndromes with complex partial seizures, not intractable, without status epilepticus: Secondary | ICD-10-CM

## 2023-08-08 ENCOUNTER — Ambulatory Visit (INDEPENDENT_AMBULATORY_CARE_PROVIDER_SITE_OTHER): Payer: MEDICAID | Admitting: Neurology

## 2023-08-08 ENCOUNTER — Encounter: Payer: Self-pay | Admitting: Neurology

## 2023-08-08 VITALS — BP 128/67 | HR 63 | Resp 17 | Wt 184.1 lb

## 2023-08-08 DIAGNOSIS — Z9689 Presence of other specified functional implants: Secondary | ICD-10-CM

## 2023-08-08 DIAGNOSIS — F70 Mild intellectual disabilities: Secondary | ICD-10-CM

## 2023-08-08 DIAGNOSIS — G40319 Generalized idiopathic epilepsy and epileptic syndromes, intractable, without status epilepticus: Secondary | ICD-10-CM

## 2023-08-08 NOTE — Progress Notes (Signed)
[facility]  PATIENT: Zachary [name] DOB: 2001/08/04  REFERRING CLINICIAN: Corrington, Kip A, MD HISTORY FROM: Patient and Grandmother  REASON FOR VISIT: Seizures    HISTORICAL  CHIEF COMPLAINT:  Chief Complaint  Patient presents with   Seizures    Rm13, stepgrandma present, paternal grandmother who had been bringing him and whom he lived with is now deceased.  [name] reprogramming:pt stated that he swipes ok but he is nocturnal. They are unaware of sz last occurrence but he doesn't think he has had one recently    INTERVAL HISTORY 08/08/2023:  Zachary [name] presents today for follow-up, last visit was in May, unfortunately his grandmother passed away about 8 weeks ago.  He is accompanied today by his stepgrandmother.  He is compliant with his medication, father is helping with his medication.  He continues to swipe his [name] at least 3 times a day, denies any side effect and denies any seizures.  No other complaints or concerns.   INTERVAL HISTORY 05/03/2023:  Zachary [name] presents today for follow-up, he is accompanied by grandmother.  Last visit was On May 8 and since then he has not had any seizure or seizure-like activity.  He continues to tolerate the [name] settings very well.  No other complaint or concerns.   INTERVAL HISTORY 04/19/2023:  Zachary [name] presents today for follow-up, he is accompanied by grandmother.  Last visit was on April 24.  Since then, he has not had any seizure or seizure-like activity.  He is compliant with his medications and also swipes his [name] at least 4 times daily.  No other complaints, no other concerns.  He said yesterday he went and ride his motorbike and had sunburn in his hands otherwise he has been doing well.   INTERVAL HISTORY 04/05/2023:  Zachary [name] presents today for follow-up, he is accompanied by grandmother.  Last visit was on March 20 at that time we had increased his [name] settings and he tolerated the procedure very well.  States that he has been  doing well until April 3 when he had a breakthrough seizure.  Per grandmother he had a cluster of 3 seizures while fishing.  He did fall to the water but luckily his cousin was there and got him out of the water.  There is a possibility that he had missed his medication because he has run out.  Since then he has been doing well.  No additional seizures.  He continued to swipe his [name] and no no side effect.   INTERVAL HISTORY 03/01/2023:  Zachary [name] presents today for follow-up, he is accompanied by grandmother.  Last visit was on March 6.  Since then he has not had any seizure or seizure-like activity.  His [name] setting was changed at that time and he tolerated procedure well.  He reports swiping his [name] at least 2-3 times per day.  Overall tolerating the [name] changes very well, no seizure, no concerns and no other complaints.   INTERVAL HISTORY 02/15/2023:  Zachary [name] present today for follow-up, he is accompanied by mother and cousin.  Last visit was on February 22.  His [name] setting at that time was increased.  He tolerated procedure very well, denies any side effect.  Again denies any side effect from the seizure medication and no seizures since last visit.  Overall he is stable.  He is ready for another [name] setting changes.   INTERVAL HISTORY 02/02/2023 Zachary [name] presents today for follow-up.  He is accompanied by grandmother.  Last visit was in  November 29.  He missed a couple appointments.  He reported having a breakthrough seizure 2 weeks ago.  Grandmother reports the seizure being a generalized tonic-clonic seizure, she did not use the Valtoco as rescue medication.  He might have made mistake regarding his antiseizure medications, Keppra and the lamotrigine but unsure which mistake he made because both of his medication are twice a day and he felt like he took his medications twice that day.  Unclear but he still had a seizure. Since then he has not had any additional seizures.  [name] was turned on at last visit,  he has tolerated well.    INTERVAL HISTORY 11/09/22:  Zachary [name] presents today for follow-up, he is accompanied by both grandmother and father.  Last visit was on October 23, since then he has not had any seizures.  He is tolerating the [name] very well.  Denies any excessive coughing, denies any hoarseness of voice.  Overall no complaints doing well.   INTERVAL HISTORY 10/03/22:  Patient presents today for follow-up, he is accompanied by grandmother.  He has a [name] placed on October 2.  Since then he has not had any seizure or seizure-like activity.  Overall doing well.  Tolerated the procedure very well.   INTERVAL HISTORY 07/12/22:  Zachary [name] presents today with grandmother. At last visit, plan was to start Cenobamate, after taking the medication for about  week, he has worsening behavior, increase aggression and the medication was discontinue and his behavior is back to normal. He denies any additional seizure since last visit. Currently he is on Keppra, Lamotrigine and Clobazam.  He has not completed the EEG yet.    INTERVAL HISTORY 05/23/2022:  Zachary [name] presents today for follow-up.  He is accompanied by his grandmother.  Last visit was in October, at that time I started him on clobazam.  Grandmother reports since starting the clobazam he has been doing well actually, was seizure-free up for about 3 months after that.  But seizure return at 2 months ago during this time also Zachary [name] was noted to start using kratom. Grandmother was not aware of it until later when Zachary [name] told him about the Kratom.  He reported that yesterday he had 2 strong generalized convulsions back-to-back each lasting about 3 minutes.  She did not take him to the emergency department.  Zachary [name] reported he has not taken the kratom for about 2 weeks.  He is compliant with his current medications.  He continues to work.  Currently he is on clobazam 10 mg nightly lamotrigine 400 mg in the morning and 500 mg at night levetiracetam 1000 mg  twice daily.   HISTORY OF PRESENT ILLNESS:  This is a 22 year old gentleman with past medical history of generalized seizure, autism spectrum disorder and depression who is presenting for management of his epilepsy.  Patient was previously under the care of Dr. Sharene Skeans for his seizure disorder.  Per chart review he has his first seizure in 2012 described as generalized tonic-clonic, at that time he had a EEG that showed high amplitude generalized spikes, polyspike or slow wave discharges that occur singly at 1 to 2 seconds of cluster.  He did have a second seizure in March 2013 while traveling in car with his father there was flickering of some light to the trees.  He began to blink, stare and then has a generalized tonic-clonic seizure.  At that time he had a head CT which was normal.  He was initially started on Depakote but did not  tolerate due to agitation. Currently he is on Keppra 1000 mg twice daily and lamotrigine 400 mg in the morning and 500 mg at night.  He is also on Klonopin 1.25 mg at bedtime.  Grandmother reports that the last seizure occurred 38-month ago while he was attending a car race.  He reported there was light on the track but the cars themselves did not have any headlights.  Prior to that his previous seizure was a couple weeks before.  They do not have a rescue medication.   Handedness: Right handed   Seizure Type: Focal seizures and generalized seizures  Current frequency: Last seizure 2 months   Any injuries from seizures: None   Seizure risk factors: Autism,   Previous ASMs: Depakote, Klonopin  Currenty ASMs: Lamotrigine 400 mg AM and 500 mg PM, Levetiracetam 1000 mg BID and Clobazam 10 mg at bedtime   ASMs side effects: None   Brain Images: normal Heat CT   Previous EEGs: High amplitude generalized spike polyspike and slow wave   OTHER MEDICAL CONDITIONS: Generalized epilepsy, ADHD, autism, depression  REVIEW OF SYSTEMS: Full 14 system review of systems  performed and negative with exception of: As noted in the HPI  ALLERGIES: Allergies  Allergen Reactions   Strawberry Extract Rash    HOME MEDICATIONS: Outpatient Medications Prior to Visit  Medication Sig Dispense Refill   acetaminophen (TYLENOL) 500 MG tablet Take 500 mg by mouth every 6 (six) hours as needed for mild pain.     amantadine (SYMMETREL) 100 MG capsule TAKE 1 CAPSULE BY MOUTH TWICE A DAY 60 capsule 2   cloBAZam (ONFI) 20 MG tablet Take 1 tablet (20 mg total) by mouth at bedtime. 90 tablet 5   diazePAM, 15 MG Dose, (VALTOCO 15 MG DOSE) 2 x 7.5 MG/0.1ML LQPK Place 15 mg into the nose as needed (For seizure lasting more than 5 minutes). 2 each 5   lamoTRIgine (LAMICTAL) 100 MG tablet 400 mg AM and 500 PM 810 tablet 3   levETIRAcetam (KEPPRA) 1000 MG tablet Take 1 tablet (1,000 mg total) by mouth 2 (two) times daily. 360 tablet 3   Melatonin 10 MG CAPS Take 10 mg by mouth at bedtime.     Multiple Vitamin (MULTIVITAMIN WITH MINERALS) TABS tablet Take 1 tablet by mouth daily.     No facility-administered medications prior to visit.    PAST MEDICAL HISTORY: Past Medical History:  Diagnosis Date   ADHD (attention deficit hyperactivity disorder)    Autism    MDD (major depressive disorder)    Post traumatic stress disorder (PTSD)    Seasonal allergies    Seizures (HCC)    Social anxiety disorder of childhood     PAST SURGICAL HISTORY: Past Surgical History:  Procedure Laterality Date   CIRCUMCISION  2002   VAGUS NERVE STIMULATOR INSERTION Left 09/12/2022   Procedure: VAGAL NERVE STIMULATOR IMPLANT;  Surgeon: Lisbeth Renshaw, MD;  Location: [facility] OR;  Service: Neurosurgery;  Laterality: Left;    FAMILY HISTORY: Family History  Problem Relation Age of Onset   Other Mother        Slow Learner   Seizures Mother        Had Seizures Due to Head Trauma   Other Father        Slow Learner   Asthma Sister    Seizures Maternal Grandmother        Generalized Seizures    Heart attack Maternal Grandfather  Died at 93    SOCIAL HISTORY: Social History   Socioeconomic History   Marital status: Single    Spouse name: Not on file   Number of children: Not on file   Years of education: Not on file   Highest education level: Not on file  Occupational History   Not on file  Tobacco Use   Smoking status: Every Day    Types: E-cigarettes   Smokeless tobacco: Current    Types: Snuff  Vaping Use   Vaping status: Some Days  Substance and Sexual Activity   Alcohol use: Yes    Alcohol/week: 1.0 standard drink of alcohol    Types: 1 Cans of beer per week   Drug use: No   Sexual activity: Not Currently  Other Topics Concern   Not on file  Social History Narrative   Zachary [name] is no longer in school.   He lives with his grandmother.   He enjoys working with his hands, mowing, weed eating, drawing, and playing on his phone.   Social Determinants of Health   Financial Resource Strain: Low Risk  (07/25/2023)   Received from [facility]   Overall Financial Resource Strain (CARDIA)    Difficulty of Paying Living Expenses: Not hard at all  Food Insecurity: No Food Insecurity (07/25/2023)   Received from [facility]   Hunger Vital Sign    Worried About Running Out of Food in the Last Year: Never true    Ran Out of Food in the Last Year: Never true  Transportation Needs: No Transportation Needs (07/25/2023)   Received from [facility] - Transportation    Lack of Transportation (Medical): No    Lack of Transportation (Non-Medical): No  Physical Activity: Not on file  Stress: Not on file  Social Connections: Unknown (04/24/2022)   Received from [facility], [facility]   Social Network    Social Network: Not on file  Intimate Partner Violence: Unknown (03/16/2022)   Received from [facility], [facility]   HITS    Physically Hurt: Not on file    Insult or Talk Down To: Not on file    Threaten Physical Harm: Not on file    Scream  or Curse: Not on file     PHYSICAL EXAM  GENERAL EXAM/CONSTITUTIONAL: Vitals:  Vitals:   08/08/23 1509  BP: 128/67  Pulse: 63  Resp: 17  Weight: 184 lb 1.4 oz (83.5 kg)     Body mass index is 26.41 kg/m. Wt Readings from Last 3 Encounters:  08/08/23 184 lb 1.4 oz (83.5 kg)  05/03/23 184 lb (83.5 kg)  04/19/23 185 lb (83.9 kg)   Patient is in no distress; well developed, nourished and groomed; neck is supple  MUSCULOSKELETAL: Gait, strength, tone, movements noted in Neurologic exam below  NEUROLOGIC: MENTAL STATUS:      No data to display         awake, alert, oriented to person, place and time recent and remote memory intact normal attention and concentration language fluent, comprehension intact, naming intact   CRANIAL NERVE:  2nd, 3rd, 4th, 6th -visual fields full to confrontation, extraocular muscles intact, no nystagmus 5th - facial sensation symmetric 7th - facial strength symmetric 8th - hearing intact 9th - palate elevates symmetrically, uvula midline 11th - shoulder shrug symmetric 12th - tongue protrusion midline  MOTOR:  normal bulk and tone, full strength in the BUE, BLE  SENSORY:  normal and symmetric to light touch  GAIT/STATION:  normal   DIAGNOSTIC DATA (LABS, IMAGING, TESTING) - I reviewed patient records, labs, notes, testing and imaging myself where available.  Lab Results  Component Value Date   WBC 6.2 09/07/2022   HGB 15.9 09/07/2022   HCT 44.8 09/07/2022   MCV 87.5 09/07/2022   PLT 246 09/07/2022      Component Value Date/Time   NA 140 09/07/2022 1600   NA 141 10/05/2021 1511   K 3.9 09/07/2022 1600   CL 106 09/07/2022 1600   CO2 29 09/07/2022 1600   GLUCOSE 92 09/07/2022 1600   BUN 9 09/07/2022 1600   BUN 5 (L) 10/05/2021 1511   CREATININE 1.13 09/07/2022 1600   CALCIUM 9.3 09/07/2022 1600   PROT 8.1 08/10/2021 0133   ALBUMIN 4.7 08/10/2021 0133   AST 24 08/10/2021 0133   ALT 31 08/10/2021 0133   ALKPHOS  98 08/10/2021 0133   BILITOT 0.9 08/10/2021 0133   GFRNONAA >60 09/07/2022 1600   GFRAA >60 06/22/2019 1431   No results found for: "CHOL", "HDL", "LDLCALC", "LDLDIRECT", "TRIG" No results found for: "HGBA1C" No results found for: "VITAMINB12" Lab Results  Component Value Date   TSH 3.194 12/29/2010    Head CT 2012 Normal head CT   EEG 2015 This is a abnormal record with the patient awake.  Is characterized by generalized spike and slow-wave activity and bilateral occipital spike and slow-wave discharges that are epileptogenic from an electrographic viewpoint.  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   ASSESSMENT AND PLAN  22 y.o. year old male  with past medical history of generalized epilepsy, mild intellectual disability, autism spectrum disorder and depression here for follow up.  He is on 3 AED including lamotrigine 400 mg in the morning and 500 mg at night, levetiracetam 1000 mg twice daily and clobazam 10 mg nightly.  He is status post [name] therapy on September 12, 2021 tolerated the procedure very well, and he had a breakthrough seizure early April 2024 in the setting of missing his medication, ?poor sleep. He continues to do well, tolerating the [name] setting well. Plan to continue current medication and to see him in 6 months, at that time, if he is doing well, we will decrease his Keppra to 750 mg BID.    1. 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 S/P placement of [name] (vagus nerve stimulation) device   3. Mild intellectual disability      PLAN: Continue with current medications   Keppra 1000 mg twice a day   Lamictal 400 mg in the morning and 500 mg at night   Clobazam 10 mg nightly  Valtoco as rescue medication  [name] setting changes today. Tolerated procedure well  Follow up in 6 months or sooner if worse    Per [facility] statutes, patients with seizures are not allowed to drive  until they have been seizure-free for six months.  Other recommendations include using caution when using heavy equipment or power tools. Avoid working on ladders or at heights. Take showers instead of baths.  Do not swim alone.  Ensure the water temperature is not too high on the home water heater. Do not go swimming alone. Do not lock yourself in a room alone (i.e. bathroom). When caring for infants or small children, sit down when holding, feeding, or changing them to minimize risk of injury to the child in the event you have a seizure. Maintain good  sleep hygiene. Avoid alcohol.  Also recommend adequate sleep, hydration, good diet and minimize stress.   During the Seizure  - First, ensure adequate ventilation and place patients on the floor on their left side  Loosen clothing around the neck and ensure the airway is patent. If the patient is clenching the teeth, do not force the mouth open with any object as this can cause severe damage - Remove all items from the surrounding that can be hazardous. The patient may be oblivious to what's happening and may not even know what he or she is doing. If the patient is confused and wandering, either gently guide him/her away and block access to outside areas - Reassure the individual and be comforting - Call 911. In most cases, the seizure ends before EMS arrives. However, there are cases when seizures may last over 3 to 5 minutes. Or the individual may have developed breathing difficulties or severe injuries. If a pregnant patient or a person with diabetes develops a seizure, it is prudent to call an ambulance. - Finally, if the patient does not regain full consciousness, then call EMS. Most patients will remain confused for about 45 to 90 minutes after a seizure, so you must use judgment in calling for help. - Avoid restraints but make sure the patient is in a bed with padded side rails - Place the individual in a lateral position with the neck slightly  flexed; this will help the saliva drain from the mouth and prevent the tongue from falling backward - Remove all nearby furniture and other hazards from the area - Provide verbal assurance as the individual is regaining consciousness - Provide the patient with privacy if possible - Call for help and start treatment as ordered by the caregiver   After the Seizure (Postictal Stage)  After a seizure, most patients experience confusion, fatigue, muscle pain and/or a headache. Thus, one should permit the individual to sleep. For the next few days, reassurance is essential. Being calm and helping reorient the person is also of importance.  Most seizures are painless and end spontaneously. Seizures are not harmful to others but can lead to complications such as stress on the lungs, brain and the heart. Individuals with prior lung problems may develop labored breathing and respiratory distress.     No orders of the defined types were placed in this encounter.   No orders of the defined types were placed in this encounter.   Return in about 6 months (around 02/08/2024).     Windell Norfolk, MD 08/08/2023, 3:35 PM  [facility] [address] [place], [place] [address] [phone]

## 2023-08-08 NOTE — Patient Instructions (Signed)
Continue current medications  [name] setting changed today, tolerated procedure well  Return in 6 months or sooner if worse

## 2023-08-26 ENCOUNTER — Other Ambulatory Visit: Payer: Self-pay | Admitting: Neurology

## 2023-08-26 DIAGNOSIS — G40309 Generalized idiopathic epilepsy and epileptic syndromes, not intractable, without status epilepticus: Secondary | ICD-10-CM

## 2023-10-07 ENCOUNTER — Other Ambulatory Visit: Payer: Self-pay | Admitting: Neurology

## 2023-10-09 ENCOUNTER — Other Ambulatory Visit: Payer: Self-pay | Admitting: Neurology

## 2023-10-09 DIAGNOSIS — G40309 Generalized idiopathic epilepsy and epileptic syndromes, not intractable, without status epilepticus: Secondary | ICD-10-CM

## 2023-10-09 DIAGNOSIS — F6381 Intermittent explosive disorder: Secondary | ICD-10-CM

## 2023-10-09 DIAGNOSIS — G40219 Localization-related (focal) (partial) symptomatic epilepsy and epileptic syndromes with complex partial seizures, intractable, without status epilepticus: Secondary | ICD-10-CM

## 2023-10-09 MED ORDER — LEVETIRACETAM 1000 MG PO TABS: 1000.0000 mg | ORAL_TABLET | Freq: Two times a day (BID) | ORAL | 3 refills | Status: DC

## 2023-10-09 MED ORDER — CLOBAZAM 20 MG PO TABS: 20.0000 mg | ORAL_TABLET | Freq: Every evening | ORAL | 5 refills | Status: DC

## 2023-10-09 MED ORDER — AMANTADINE HCL 100 MG PO CAPS: ORAL_CAPSULE | ORAL | 2 refills | Status: AC

## 2023-10-09 NOTE — Telephone Encounter (Signed)
Requested Prescriptions   Pending Prescriptions Disp Refills   amantadine (SYMMETREL) 100 MG capsule 60 capsule 2    Sig: TAKE 1 CAPSULE BY MOUTH TWICE A DAY   cloBAZam (ONFI) 20 MG tablet 90 tablet 5    Sig: Take 1 tablet (20 mg total) by mouth at bedtime.   levETIRAcetam (KEPPRA) 1000 MG tablet 360 tablet 3    Sig: Take 1 tablet (1,000 mg total) by mouth 2 (two) times daily.   Last seen 08/08/23 Next appt 02/19/24  Dispenses   Dispensed Days Supply Quantity Provider Pharmacy  CLOBAZAM 20 MG TABLET 09/07/2023 30 30 each Windell Norfolk, MD [facility] [phone] - [place]...  CLOBAZAM 20 MG TABLET 08/04/2023 30 30 each Windell Norfolk, MD [facility] [phone] - [place]...  CLOBAZAM 20 MG TABLET 07/14/2023 20 20 each Windell Norfolk, MD [facility] [phone] - [place]...  CLOBAZAM 20 MG TABLET 06/26/2023 20 20 each Windell Norfolk, MD [facility] [phone] - [place]...  CLOBAZAM 20 MG TABLET 06/08/2023 20 20 each Windell Norfolk, MD [facility] [phone] - [place]...  CLOBAZAM 20 MG TABLET 05/05/2023 30 30 each Windell Norfolk, MD [facility] [phone] - [place]...  CLOBAZAM 20 MG TABLET 04/06/2023 30 30 each Windell Norfolk, MD [facility] [phone] - [place]...  CLOBAZAM 10 MG TABLET 03/07/2023 30 30 each Windell Norfolk, MD [facility] [phone] - [place]...  CLOBAZAM 10 MG TABLET 02/06/2023 30 30 each Windell Norfolk, MD [facility] [phone] - [place]...  CLOBAZAM 10 MG TABLET 01/07/2023 30 30 each Windell Norfolk, MD [facility] [phone] - [place]...  CLOBAZAM 10 MG TABLET 12/05/2022 30 30 each Windell Norfolk, MD [facility] [phone] - [place]...  CLOBAZAM 10 MG TABLET 11/07/2022 30 30 each Windell Norfolk, MD [facility] [phone] - [facility].Marland KitchenMarland Kitchen

## 2023-10-09 NOTE — Telephone Encounter (Signed)
Requested Prescriptions   Pending Prescriptions Disp Refills   cloBAZam (ONFI) 20 MG tablet [Pharmacy Med Name: CLOBAZAM 20 MG TABLET] 30 tablet     Sig: TAKE 1 TABLET BY MOUTH EVERYDAY AT BEDTIME   Last seen 08/08/23 02/19/24 is next appt    Dispenses    Dispensed Days Supply Quantity Provider Pharmacy  CLOBAZAM 20 MG TABLET 09/07/2023 30 30 each Windell Norfolk, MD [facility] [phone] - [place]...  CLOBAZAM 20 MG TABLET 08/04/2023 30 30 each Windell Norfolk, MD [facility] [phone] - [place]...  CLOBAZAM 20 MG TABLET 07/14/2023 20 20 each Windell Norfolk, MD [facility] [phone] - [place]...  CLOBAZAM 20 MG TABLET 06/26/2023 20 20 each Windell Norfolk, MD [facility] [phone] - [place]...  CLOBAZAM 20 MG TABLET 06/08/2023 20 20 each Windell Norfolk, MD [facility] [phone] - [place]...  CLOBAZAM 20 MG TABLET 05/05/2023 30 30 each Windell Norfolk, MD [facility] [phone] - [place]...  CLOBAZAM 20 MG TABLET 04/06/2023 30 30 each Windell Norfolk, MD [facility] [phone] - [place]...  CLOBAZAM 10 MG TABLET 03/07/2023 30 30 each Windell Norfolk, MD [facility] [phone] - [place]...  CLOBAZAM 10 MG TABLET 02/06/2023 30 30 each Windell Norfolk, MD [facility] [phone] - [place]...  CLOBAZAM 10 MG TABLET 01/07/2023 30 30 each Windell Norfolk, MD [facility] [phone] - [place]...  CLOBAZAM 10 MG TABLET 12/05/2022 30 30 each Windell Norfolk, MD [facility] [phone] - K...  CLOBAZAM 10 MG TABLET 11/07/2022 30 30 each Windell Norfolk, MD [facility] [phone] - K.Marland KitchenMarland Kitchen

## 2023-10-09 NOTE — Telephone Encounter (Signed)
Pt is requesting a refill for amantadine (SYMMETREL) 100 MG capsule, cloBAZam (ONFI) 20 MG tablet, levETIRAcetam (KEPPRA) 1000 MG tablet.  [facility] [phone]

## 2024-01-07 ENCOUNTER — Other Ambulatory Visit: Payer: Self-pay | Admitting: Neurology

## 2024-01-07 DIAGNOSIS — G40219 Localization-related (focal) (partial) symptomatic epilepsy and epileptic syndromes with complex partial seizures, intractable, without status epilepticus: Secondary | ICD-10-CM

## 2024-01-07 DIAGNOSIS — G40309 Generalized idiopathic epilepsy and epileptic syndromes, not intractable, without status epilepticus: Secondary | ICD-10-CM

## 2024-02-19 ENCOUNTER — Ambulatory Visit (INDEPENDENT_AMBULATORY_CARE_PROVIDER_SITE_OTHER): Payer: MEDICAID | Admitting: Neurology

## 2024-02-19 ENCOUNTER — Encounter: Payer: Self-pay | Admitting: Neurology

## 2024-02-19 VITALS — BP 130/80 | Ht 70.0 in | Wt 180.0 lb

## 2024-02-19 DIAGNOSIS — Z5181 Encounter for therapeutic drug level monitoring: Secondary | ICD-10-CM | POA: Diagnosis not present

## 2024-02-19 DIAGNOSIS — G40319 Generalized idiopathic epilepsy and epileptic syndromes, intractable, without status epilepticus: Secondary | ICD-10-CM

## 2024-02-19 DIAGNOSIS — Z9689 Presence of other specified functional implants: Secondary | ICD-10-CM
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
## 2024-02-24 LAB — COMPREHENSIVE METABOLIC PANEL
ALT: 21 IU/L (ref 0–44)
AST: 17 IU/L (ref 0–40)
Albumin: 5 g/dL (ref 4.3–5.2)
Alkaline Phosphatase: 96 IU/L (ref 44–121)
BUN/Creatinine Ratio: 7 — ABNORMAL LOW (ref 9–20)
BUN: 8 mg/dL (ref 6–20)
Bilirubin Total: 0.8 mg/dL (ref 0.0–1.2)
CO2: 27 mmol/L (ref 20–29)
Calcium: 10.2 mg/dL (ref 8.7–10.2)
Chloride: 100 mmol/L (ref 96–106)
Creatinine, Ser: 1.12 mg/dL (ref 0.76–1.27)
Globulin, Total: 2.4 g/dL (ref 1.5–4.5)
Glucose: 92 mg/dL (ref 70–99)
Potassium: 4.1 mmol/L (ref 3.5–5.2)
Sodium: 139 mmol/L (ref 134–144)
Total Protein: 7.4 g/dL (ref 6.0–8.5)
eGFR: 95 mL/min/{1.73_m2} (ref >59)

## 2024-02-24 LAB — LEVETIRACETAM LEVEL: Levetiracetam Lvl: 22.7 ug/mL (ref 10.0–40.0)

## 2024-02-24 LAB — CLOBAZAM
Clobazam: 167 ng/mL (ref 30–300)
Desmethylclobazam: 298 ng/mL — ABNORMAL LOW (ref 300–3000)

## 2024-02-24 LAB — LAMOTRIGINE LEVEL: Lamotrigine Lvl: 13.8 ug/mL (ref 2.0–20.0)

## 2024-02-26 ENCOUNTER — Encounter: Payer: Self-pay | Admitting: Neurology

## 2024-04-07 ENCOUNTER — Other Ambulatory Visit: Payer: Self-pay | Admitting: Neurology

## 2024-04-08 NOTE — Telephone Encounter (Signed)
 Last seen 02/19/24 Next appt 08/27/24  Dispenses   Dispensed Days Supply Quantity Provider Pharmacy  CLOBAZAM      TAB 20MG  01/10/2024 90 90 each Camara, Amadou, MD [facility] [phone] - [place]...  CLOBAZAM  20 MG TABLET 01/10/2024 6 6 each Camara, Amadou, MD [facility] [phone] - [place]...  CLOBAZAM  20 MG TABLET 10/10/2023 90 90 each Camara, Amadou, MD [facility] [phone] - K...  CLOBAZAM  20 MG TABLET 09/07/2023 30 30 each Camara, Amadou, MD [facility] [phone] - K...  CLOBAZAM  20 MG TABLET 08/04/2023 30 30 each Camara, Amadou, MD [facility] [phone] - K...  CLOBAZAM  20 MG TABLET 07/14/2023 20 20 each Camara, Amadou, MD [facility] [phone] - K...  CLOBAZAM  20 MG TABLET 06/26/2023 20 20 each Camara, Amadou, MD [facility] [phone] - K...  CLOBAZAM  20 MG TABLET 06/08/2023 20 20 each Camara, Amadou, MD [facility] [phone] - K...  CLOBAZAM  20 MG TABLET 05/05/2023 30 30 each Camara, Amadou, MD [facility] [phone] - K...    PLAN: Continue with current medications              Keppra  1000 mg twice a day              Lamictal  400 mg in the morning and 500 mg at night              Clobazam  20 mg nightly

## 2024-05-14 ENCOUNTER — Telehealth: Payer: Self-pay | Admitting: Neurology

## 2024-05-14 NOTE — Telephone Encounter (Signed)
 Please add him to my cancellation list.

## 2024-05-14 NOTE — Telephone Encounter (Signed)
 FYI-Pt's father called wanting to inform the provider that the pt has had seizures on: 4/12 5/14 5/30 And they were all pretty much in the afternoon lasting about 2-3 min

## 2024-05-14 NOTE — Telephone Encounter (Signed)
 Called father of pt and was informed that he has been missing medications and not swiping his vns wristband the patient father stated that he got a skin tear on his arm but that's all. Pt father stated that he thinks it comes from stress from being overwhelmed.

## 2024-05-15 NOTE — Telephone Encounter (Signed)
 Called and let pt father know to have pt be more compliant w/sz meds and we put him on cancellation list.

## 2024-08-27 ENCOUNTER — Encounter: Payer: Self-pay | Admitting: Neurology

## 2024-08-27 ENCOUNTER — Ambulatory Visit (INDEPENDENT_AMBULATORY_CARE_PROVIDER_SITE_OTHER): Payer: MEDICAID | Admitting: Neurology

## 2024-08-27 VITALS — BP 127/75 | HR 78 | Ht 70.0 in | Wt 171.0 lb

## 2024-08-27 DIAGNOSIS — G40319 Generalized idiopathic epilepsy and epileptic syndromes, intractable, without status epilepticus: Secondary | ICD-10-CM | POA: Diagnosis not present

## 2024-08-27 DIAGNOSIS — Z9689 Presence of other specified functional implants: Secondary | ICD-10-CM

## 2024-08-27 DIAGNOSIS — G40309 Generalized idiopathic epilepsy and epileptic syndromes, not intractable, without status epilepticus: Secondary | ICD-10-CM | POA: Diagnosis not present

## 2024-08-27 MED ORDER — LAMOTRIGINE 200 MG PO TABS: 400.0000 mg | ORAL_TABLET | Freq: Two times a day (BID) | ORAL | 3 refills | Status: AC

## 2024-08-27 MED ORDER — TOPIRAMATE 50 MG PO TABS: 50.0000 mg | ORAL_TABLET | Freq: Every evening | ORAL | 3 refills | Status: AC

## 2024-08-27 NOTE — Patient Instructions (Addendum)
 Decrease Lamotrigine  to 400 mg twice daily  Start Topiramate  50 mg nightly for migraine prevention  Continue your other medications The [name] interrogated, change made, tolerated procedure well Continue to swipe [name] up to 3 times daily  Will refill his Valtoco  Return in 6 months or sooner if worse

## 2024-08-27 NOTE — Progress Notes (Unsigned)
 [facility]  PATIENT: Zachary [name] DOB: [birth year]  REFERRING CLINICIAN: Corrington, Kip A, MD HISTORY FROM: Patient and Grandmother  REASON FOR VISIT: Seizures    HISTORICAL  CHIEF COMPLAINT:  Chief Complaint  Patient presents with   Follow-up    Pt in room 12. Dad and aunt in room. Here for seizures post [name].   INTERVAL HISTORY 08/27/2024 Zachary [name] presented for follow-up, he is accompanied by father and sister-in-law.  Last visit was in March, since then he did have additional seizures, 1 in April; 2 in May and his last seizure was in June 25.  He is compliant with his medications including clobazam  20 mg at bedtime, lamotrigine  400/500 and Keppra  1000 mg twice daily.  Denies any side effects.  His main complaint today is Headaches.  With the headaches, he has to go lay down in a dark place.  He reports some sensitivity to noise and light.  Denies any vomiting with his headaches.   INTERVAL HISTORY 02/19/2023:  Zachary [name] presents today for follow-up, he is accompanied by father.  Last visit was in August since then he continues to do well, denies any seizure or seizure like activity.  He is compliant with his medications and continue to swipe his [name] daily. No other concerns or questions. Last seizure April 2024.   INTERVAL HISTORY 08/08/2023:  Zachary [name] presents today for follow-up, last visit was in May, unfortunately his grandmother passed away about 8 weeks ago.  He is accompanied today by his stepgrandmother.  He is compliant with his medication, father is helping with his medication.  He continues to swipe his [name] at least 3 times a day, denies any side effect and denies any seizures.  No other complaints or concerns.   INTERVAL HISTORY 05/03/2023:  Zachary [name] presents today for follow-up, he is accompanied by grandmother.  Last visit was On May 8 and since then he has not had any seizure or seizure-like activity.  He continues to tolerate the [name] settings very well.  No  other complaint or concerns.   INTERVAL HISTORY 04/19/2023:  Zachary [name] presents today for follow-up, he is accompanied by grandmother.  Last visit was on April 24.  Since then, he has not had any seizure or seizure-like activity.  He is compliant with his medications and also swipes his [name] at least 4 times daily.  No other complaints, no other concerns.  He said yesterday he went and ride his motorbike and had sunburn in his hands otherwise he has been doing well.   INTERVAL HISTORY 04/05/2023:  Zachary [name] presents today for follow-up, he is accompanied by grandmother.  Last visit was on March 20 at that time we had increased his [name] settings and he tolerated the procedure very well.  States that he has been doing well until April 3 when he had a breakthrough seizure.  Per grandmother he had a cluster of 3 seizures while fishing.  He did fall to the water  but luckily his cousin was there and got him out of the water .  There is a possibility that he had missed his medication because he has run out.  Since then he has been doing well.  No additional seizures.  He continued to swipe his [name] and no no side effect.   INTERVAL HISTORY 03/01/2023:  Zachary [name] presents today for follow-up, he is accompanied by grandmother.  Last visit was on March 6.  Since then he has not had any seizure or seizure-like activity.  His [name] setting was  changed at that time and he tolerated procedure well.  He reports swiping his [name] at least 2-3 times per day.  Overall tolerating the [name] changes very well, no seizure, no concerns and no other complaints.   INTERVAL HISTORY 02/15/2023:  Zachary [name] present today for follow-up, he is accompanied by mother and cousin.  Last visit was on February 22.  His [name] setting at that time was increased.  He tolerated procedure very well, denies any side effect.  Again denies any side effect from the seizure medication and no seizures since last visit.  Overall he is stable.  He is ready for another [name]  setting changes.   INTERVAL HISTORY 02/02/2023 Zachary [name] presents today for follow-up.  He is accompanied by grandmother.  Last visit was in November 29.  He missed a couple appointments.  He reported having a breakthrough seizure 2 weeks ago.  Grandmother reports the seizure being a generalized tonic-clonic seizure, she did not use the Valtoco  as rescue medication.  He might have made mistake regarding his antiseizure medications, Keppra  and the lamotrigine  but unsure which mistake he made because both of his medication are twice a day and he felt like he took his medications twice that day.  Unclear but he still had a seizure. Since then he has not had any additional seizures.  [name] was turned on at last visit, he has tolerated well.    INTERVAL HISTORY 11/09/22:  Zachary [name] presents today for follow-up, he is accompanied by both grandmother and father.  Last visit was on October 23, since then he has not had any seizures.  He is tolerating the [name] very well.  Denies any excessive coughing, denies any hoarseness of voice.  Overall no complaints doing well.   INTERVAL HISTORY 10/03/22:  Patient presents today for follow-up, he is accompanied by grandmother.  He has a [name] placed on October 2.  Since then he has not had any seizure or seizure-like activity.  Overall doing well.  Tolerated the procedure very well.   INTERVAL HISTORY 07/12/22:  Zachary [name] presents today with grandmother. At last visit, plan was to start Cenobamate , after taking the medication for about  week, he has worsening behavior, increase aggression and the medication was discontinue and his behavior is back to normal. He denies any additional seizure since last visit. Currently he is on Keppra , Lamotrigine  and Clobazam .  He has not completed the EEG yet.    INTERVAL HISTORY 05/23/2022:  Zachary [name] presents today for follow-up.  He is accompanied by his grandmother.  Last visit was in October, at that time I started him on clobazam .   Grandmother reports since starting the clobazam  he has been doing well actually, was seizure-free up for about 3 months after that.  But seizure return at 2 months ago during this time also Jermal was noted to start using kratom. Grandmother was not aware of it until later when Daisuke told him about the Kratom.  He reported that yesterday he had 2 strong generalized convulsions back-to-back each lasting about 3 minutes.  She did not take him to the emergency department.  Vartan reported he has not taken the kratom for about 2 weeks.  He is compliant with his current medications.  He continues to work.  Currently he is on clobazam  10 mg nightly lamotrigine  400 mg in the morning and 500 mg at night levetiracetam  1000 mg twice daily.   HISTORY OF PRESENT ILLNESS:  This is a 23 year old gentleman with past medical history of generalized  seizure, autism spectrum disorder and depression who is presenting for management of his epilepsy.  Patient was previously under the care of Dr. Susen for his seizure disorder.  Per chart review he has his first seizure in 2012 described as generalized tonic-clonic, at that time he had a EEG that showed high amplitude generalized spikes, polyspike or slow wave discharges that occur singly at 1 to 2 seconds of cluster.  He did have a second seizure in March 2013 while traveling in car with his father there was flickering of some light to the trees.  He began to blink, stare and then has a generalized tonic-clonic seizure.  At that time he had a head CT which was normal.  He was initially started on Depakote but did not tolerate due to agitation. Currently he is on Keppra  1000 mg twice daily and lamotrigine  400 mg in the morning and 500 mg at night.  He is also on Klonopin  1.25 mg at bedtime.  Grandmother reports that the last seizure occurred 80-month ago while he was attending a car race.  He reported there was light on the track but the cars themselves did not have any  headlights.  Prior to that his previous seizure was a couple weeks before.  They do not have a rescue medication.   Handedness: Right handed   Seizure Type: Focal seizures and generalized seizures  Current frequency: Last seizure June 05 2024  Any injuries from seizures: None   Seizure risk factors: Autism,   Previous ASMs: Depakote, Klonopin   Currenty ASMs: Lamotrigine  400 mg AM and 500 mg PM, Levetiracetam  1000 mg BID and Clobazam  20 mg at bedtime   ASMs side effects: None   Brain Images: normal Heat CT   Previous EEGs: High amplitude generalized spike polyspike and slow wave   OTHER MEDICAL CONDITIONS: Generalized epilepsy, ADHD, autism, depression  REVIEW OF SYSTEMS: Full 14 system review of systems performed and negative with exception of: As noted in the HPI  ALLERGIES: Allergies  Allergen Reactions   Strawberry Extract Rash    HOME MEDICATIONS: Outpatient Medications Prior to Visit  Medication Sig Dispense Refill   acetaminophen  (TYLENOL ) 500 MG tablet Take 500 mg by mouth every 6 (six) hours as needed for mild pain.     amantadine  (SYMMETREL ) 100 MG capsule TAKE 1 CAPSULE BY MOUTH TWICE A DAY 60 capsule 2   cloBAZam  (ONFI ) 20 MG tablet TAKE 1 TABLET BY MOUTH EVERYDAY AT BEDTIME 90 tablet 3   diazePAM , 15 MG Dose, (VALTOCO  15 MG DOSE) 2 x 7.5 MG/0.1ML LQPK Place 15 mg into the nose as needed (For seizure lasting more than 5 minutes). 2 each 5   levETIRAcetam  (KEPPRA ) 1000 MG tablet TAKE 1 TABLET BY MOUTH TWICE A DAY 180 tablet 7   Melatonin 10 MG CAPS Take 10 mg by mouth at bedtime.     Multiple Vitamin (MULTIVITAMIN WITH MINERALS) TABS tablet Take 1 tablet by mouth daily.     lamoTRIgine  (LAMICTAL ) 100 MG tablet TAKE 400 MG AM AND 500 PM AS DIRECTED 810 tablet 4   No facility-administered medications prior to visit.    PAST MEDICAL HISTORY: Past Medical History:  Diagnosis Date   ADHD (attention deficit hyperactivity disorder)    Autism    MDD (major  depressive disorder)    Post traumatic stress disorder (PTSD)    Seasonal allergies    Seizures (HCC)    Social anxiety disorder of childhood     PAST SURGICAL HISTORY: Past  Surgical History:  Procedure Laterality Date   CIRCUMCISION  2002   VAGUS NERVE STIMULATOR INSERTION Left 09/12/2022   Procedure: VAGAL NERVE STIMULATOR IMPLANT;  Surgeon: Lanis Pupa, MD;  Location: [facility] OR;  Service: Neurosurgery;  Laterality: Left;    FAMILY HISTORY: Family History  Problem Relation Age of Onset   Other Mother        Slow Learner   Seizures Mother        Had Seizures Due to Head Trauma   Other Father        Slow Learner   Asthma Sister    Seizures Maternal Grandmother        Generalized Seizures   Heart attack Maternal Grandfather        Died at 55    SOCIAL HISTORY: Social History   Socioeconomic History   Marital status: Single    Spouse name: Not on file   Number of children: Not on file   Years of education: Not on file   Highest education level: Not on file  Occupational History   Not on file  Tobacco Use   Smoking status: Every Day    Types: E-cigarettes   Smokeless tobacco: Current    Types: Snuff  Vaping Use   Vaping status: Some Days  Substance and Sexual Activity   Alcohol use: Yes    Alcohol/week: 1.0 standard drink of alcohol    Types: 1 Cans of beer per week   Drug use: No   Sexual activity: Not Currently  Other Topics Concern   Not on file  Social History Narrative   Juanpablo is no longer in school.   He lives alone in tiny house on dads property .   He enjoys working with his hands, mowing, weed eating, drawing, and playing on his phone.   Social Drivers of Corporate investment banker Strain: Low Risk  (01/25/2024)   Received from [facility]   Overall Financial Resource Strain (CARDIA)    Difficulty of Paying Living Expenses: Not hard at all  Food Insecurity: No Food Insecurity (01/25/2024)   Received from [facility]   Hunger Vital Sign     Within the past 12 months, you worried that your food would run out before you got the money to buy more.: Never true    Within the past 12 months, the food you bought just didn't last and you didn't have money to get more.: Never true  Transportation Needs: No Transportation Needs (01/25/2024)   Received from [facility] - Transportation    Lack of Transportation (Medical): No    Lack of Transportation (Non-Medical): No  Physical Activity: Not on file  Stress: Not on file  Social Connections: Unknown (04/24/2022)   Received from [facility]   Social Network    Social Network: Not on file  Intimate Partner Violence: Unknown (03/16/2022)   Received from [facility]   HITS    Physically Hurt: Not on file    Insult or Talk Down To: Not on file    Threaten Physical Harm: Not on file    Scream or Curse: Not on file     PHYSICAL EXAM  GENERAL EXAM/CONSTITUTIONAL: Vitals:  Vitals:   08/27/24 1453  BP: 127/75  Pulse: 78  Weight: 171 lb (77.6 kg)  Height: 5' 10 (1.778 m)     Body mass index is 24.54 kg/m. Wt Readings from Last 3 Encounters:  08/27/24 171 lb (77.6 kg)  02/19/24 180  lb (81.6 kg)  08/08/23 184 lb 1.4 oz (83.5 kg)   Patient is in no distress; well developed, nourished and groomed; neck is supple  MUSCULOSKELETAL: Gait, strength, tone, movements noted in Neurologic exam below  NEUROLOGIC: MENTAL STATUS:      No data to display         awake, alert, oriented to person, place and time recent and remote memory intact normal attention and concentration language fluent, comprehension intact, naming intact   CRANIAL NERVE:  2nd, 3rd, 4th, 6th -visual fields full to confrontation, extraocular muscles intact, no nystagmus 5th - facial sensation symmetric 7th - facial strength symmetric 8th - hearing intact 9th - palate elevates symmetrically, uvula midline 11th - shoulder shrug symmetric 12th - tongue protrusion midline  MOTOR:   normal bulk and tone, full strength in the BUE, BLE  SENSORY:  normal and symmetric to light touch  GAIT/STATION:  normal   DIAGNOSTIC DATA (LABS, IMAGING, TESTING) - I reviewed patient records, labs, notes, testing and imaging myself where available.  Lab Results  Component Value Date   WBC 6.2 09/07/2022   HGB 15.9 09/07/2022   HCT 44.8 09/07/2022   MCV 87.5 09/07/2022   PLT 246 09/07/2022      Component Value Date/Time   NA 139 02/19/2024 1503   K 4.1 02/19/2024 1503   CL 100 02/19/2024 1503   CO2 27 02/19/2024 1503   GLUCOSE 92 02/19/2024 1503   GLUCOSE 92 09/07/2022 1600   BUN 8 02/19/2024 1503   CREATININE 1.12 02/19/2024 1503   CALCIUM 10.2 02/19/2024 1503   PROT 7.4 02/19/2024 1503   ALBUMIN 5.0 02/19/2024 1503   AST 17 02/19/2024 1503   ALT 21 02/19/2024 1503   ALKPHOS 96 02/19/2024 1503   BILITOT 0.8 02/19/2024 1503   GFRNONAA >60 09/07/2022 1600   GFRAA >60 06/22/2019 1431   No results found for: CHOL, HDL, LDLCALC, LDLDIRECT, TRIG No results found for: HGBA1C No results found for: VITAMINB12 Lab Results  Component Value Date   TSH 3.194 12/29/2010    Head CT 2012 Normal head CT   EEG 2015 This is a abnormal record with the patient awake.  Is characterized by generalized spike and slow-wave activity and bilateral occipital spike and slow-wave discharges that are epileptogenic from an electrographic viewpoint.  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   ASSESSMENT AND PLAN  23 y.o. year old male  with past medical history of generalized epilepsy, mild intellectual disability, autism spectrum disorder and depression here for follow up.  He is on 3 AED including lamotrigine  400 mg in the morning and 500 mg at night, levetiracetam  1000 mg twice daily and clobazam  20 mg nightly.  He is status post [name] therapy on September 12, 2021 tolerated the procedure very well, and he continues to have  breakthrough seizure, on average once a month, last seizure was in June 25.  Plan will be to continue his current medication, decrease his lamotrigine  to 400 mg twice daily due to his ongoing migraines and start him on topiramate  50 mg nightly.  His [name] setting was also changed today.  He tolerated the procedure very well.  Advised him to contact me for any additional concern otherwise I will see him in 6 months for follow-up or sooner if worse.      1. 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 S/P placement of [name] (vagus nerve stimulation) device  3. Generalized convulsive epilepsy (HCC)     Patient Instructions  Decrease Lamotrigine  to 400 mg twice daily  Start Topiramate  50 mg nightly for migraine prevention  Continue your other medications The [name] interrogated, change made, tolerated procedure well Continue to swipe [name] up to 3 times daily  Will refill his Valtoco  Return in 6 months or sooner if worse     Per [place]  DMV statutes, patients with seizures are not allowed to drive until they have been seizure-free for six months.  Other recommendations include using caution when using heavy equipment or power tools. Avoid working on ladders or at heights. Take showers instead of baths.  Do not swim alone.  Ensure the water  temperature is not too high on the home water  heater. Do not go swimming alone. Do not lock yourself in a room alone (i.e. bathroom). When caring for infants or small children, sit down when holding, feeding, or changing them to minimize risk of injury to the child in the event you have a seizure. Maintain good sleep hygiene. Avoid alcohol.  Also recommend adequate sleep, hydration, good diet and minimize stress.   During the Seizure  - First, ensure adequate ventilation and place patients on the floor on their left side  Loosen clothing around the neck and ensure the airway is patent. If the patient is clenching the teeth,  do not force the mouth open with any object as this can cause severe damage - Remove all items from the surrounding that can be hazardous. The patient may be oblivious to what's happening and may not even know what he or she is doing. If the patient is confused and wandering, either gently guide him/her away and block access to outside areas - Reassure the individual and be comforting - Call 911. In most cases, the seizure ends before EMS arrives. However, there are cases when seizures may last over 3 to 5 minutes. Or the individual may have developed breathing difficulties or severe injuries. If a pregnant patient or a person with diabetes develops a seizure, it is prudent to call an ambulance. - Finally, if the patient does not regain full consciousness, then call EMS. Most patients will remain confused for about 45 to 90 minutes after a seizure, so you must use judgment in calling for help. - Avoid restraints but make sure the patient is in a bed with padded side rails - Place the individual in a lateral position with the neck slightly flexed; this will help the saliva drain from the mouth and prevent the tongue from falling backward - Remove all nearby furniture and other hazards from the area - Provide verbal assurance as the individual is regaining consciousness - Provide the patient with privacy if possible - Call for help and start treatment as ordered by the caregiver   After the Seizure (Postictal Stage)  After a seizure, most patients experience confusion, fatigue, muscle pain and/or a headache. Thus, one should permit the individual to sleep. For the next few days, reassurance is essential. Being calm and helping reorient the person is also of importance.  Most seizures are painless and end spontaneously. Seizures are not harmful to others but can lead to complications such as stress on the lungs, brain and the heart. Individuals with prior lung problems may develop labored breathing and  respiratory distress.     No orders of the defined types were placed in this encounter.   Meds ordered this encounter  Medications   topiramate  (TOPAMAX ) 50  MG tablet    Sig: Take 1 tablet (50 mg total) by mouth at bedtime.    Dispense:  90 tablet    Refill:  3   lamoTRIgine  (LAMICTAL ) 200 MG tablet    Sig: Take 2 tablets (400 mg total) by mouth 2 (two) times daily. TAKE 400 MG AM AND 500 PM AS DIRECTED    Dispense:  360 tablet    Refill:  3    Return in about 6 months (around 02/24/2025).  The patient's condition of epilepsy requires frequent monitoring and adjustments in the treatment plan, reflecting the ongoing complexity of care.  This provider is the continuing focal point for all needed services for this condition.   Pastor Falling, MD 08/27/2024, 5:06 PM  [facility] [address], [address] [place], [place] [address] [phone]

## 2024-08-28 ENCOUNTER — Telehealth: Payer: Self-pay

## 2024-08-28 NOTE — Telephone Encounter (Signed)
 Pt father returnend call and asked to email so I emailed from our fax machine to [email or web address]

## 2024-08-28 NOTE — Telephone Encounter (Signed)
 Lvm 1st attempt to find out if they want the letter mailed, pick up here, or if they can view on mychart.

## 2025-01-01 ENCOUNTER — Other Ambulatory Visit: Payer: Self-pay | Admitting: Neurology

## 2025-01-01 ENCOUNTER — Other Ambulatory Visit: Payer: Self-pay

## 2025-01-01 MED ORDER — CLOBAZAM 20 MG PO TABS: 20.0000 mg | ORAL_TABLET | Freq: Every day | ORAL | 3 refills | Status: AC

## 2025-01-01 NOTE — Progress Notes (Unsigned)
 Requested Prescriptions   Pending Prescriptions Disp Refills   cloBAZam  (ONFI ) 20 MG tablet 30 tablet 5    Sig: Take 1 tablet (20 mg total) by mouth at bedtime.   Last 08/27/24 Next appt  04/01/25 Dispenses   Dispensed Days Supply Quantity Provider Pharmacy  CLOBAZAM  20 MG TABLET 10/03/2024 90 90 each Camara, Amadou, MD [facility] [phone] - [place]...  CLOBAZAM  20 MG TABLET 07/06/2024 90 90 each Camara, Amadou, MD [facility] [phone] - [place]...  CLOBAZAM  20 MG TABLET 04/09/2024 90 90 each Camara, Amadou, MD [facility] [phone] - [place]...  CLOBAZAM  20 MG TABLET 01/10/2024 6 6 each Gregg Lek, MD [facility] [phone] - [email or web address]

## 2025-04-01 ENCOUNTER — Ambulatory Visit: Payer: MEDICAID | Admitting: Neurology
